# Patient Record
Sex: Female | Born: 1960 | Race: White | Hispanic: No | Marital: Married | Smoking: Former smoker
Health system: Southern US, Community
[De-identification: ages and names within clinical notes are randomized; demographics above are authoritative.]

## PROBLEM LIST (undated history)

## (undated) DIAGNOSIS — F419 Anxiety disorder, unspecified: Secondary | ICD-10-CM

## (undated) DIAGNOSIS — I10 Essential (primary) hypertension: Secondary | ICD-10-CM

## (undated) DIAGNOSIS — R197 Diarrhea, unspecified: Secondary | ICD-10-CM

## (undated) DIAGNOSIS — M1990 Unspecified osteoarthritis, unspecified site: Secondary | ICD-10-CM

## (undated) DIAGNOSIS — F329 Major depressive disorder, single episode, unspecified: Secondary | ICD-10-CM

## (undated) DIAGNOSIS — C801 Malignant (primary) neoplasm, unspecified: Secondary | ICD-10-CM

## (undated) DIAGNOSIS — F32A Depression, unspecified: Secondary | ICD-10-CM

## (undated) DIAGNOSIS — K810 Acute cholecystitis: Secondary | ICD-10-CM

## (undated) HISTORY — PX: ABDOMINAL HYSTERECTOMY: SHX81

## (undated) HISTORY — PX: COLONOSCOPY: SHX174

## (undated) HISTORY — PX: TUBAL LIGATION: SHX77

## (undated) HISTORY — DX: Diarrhea, unspecified: R19.7

## (undated) HISTORY — DX: Acute cholecystitis: K81.0

## (undated) HISTORY — PX: TOOTH EXTRACTION: SUR596

---

## 2013-06-11 ENCOUNTER — Inpatient Hospital Stay (HOSPITAL_COMMUNITY)
Admission: EM | Admit: 2013-06-11 | Discharge: 2013-06-12 | DRG: 493 | Disposition: A | Discharge status: Home / Self Care | Payer: BC Managed Care – PPO | Attending: General Surgery | Admitting: General Surgery

## 2013-06-11 ENCOUNTER — Emergency Department (HOSPITAL_COMMUNITY): Payer: BC Managed Care – PPO

## 2013-06-11 ENCOUNTER — Encounter (HOSPITAL_COMMUNITY): Payer: Self-pay | Admitting: Emergency Medicine

## 2013-06-11 DIAGNOSIS — N83209 Unspecified ovarian cyst, unspecified side: Secondary | ICD-10-CM | POA: Diagnosis present

## 2013-06-11 DIAGNOSIS — K8000 Calculus of gallbladder with acute cholecystitis without obstruction: Principal | ICD-10-CM | POA: Diagnosis present

## 2013-06-11 DIAGNOSIS — K660 Peritoneal adhesions (postprocedural) (postinfection): Secondary | ICD-10-CM | POA: Diagnosis present

## 2013-06-11 DIAGNOSIS — E669 Obesity, unspecified: Secondary | ICD-10-CM | POA: Diagnosis present

## 2013-06-11 DIAGNOSIS — R112 Nausea with vomiting, unspecified: Secondary | ICD-10-CM | POA: Diagnosis present

## 2013-06-11 DIAGNOSIS — I10 Essential (primary) hypertension: Secondary | ICD-10-CM | POA: Diagnosis present

## 2013-06-11 DIAGNOSIS — K810 Acute cholecystitis: Secondary | ICD-10-CM

## 2013-06-11 DIAGNOSIS — F3289 Other specified depressive episodes: Secondary | ICD-10-CM | POA: Diagnosis present

## 2013-06-11 DIAGNOSIS — E876 Hypokalemia: Secondary | ICD-10-CM | POA: Diagnosis not present

## 2013-06-11 DIAGNOSIS — K219 Gastro-esophageal reflux disease without esophagitis: Secondary | ICD-10-CM | POA: Diagnosis present

## 2013-06-11 DIAGNOSIS — F329 Major depressive disorder, single episode, unspecified: Secondary | ICD-10-CM | POA: Diagnosis present

## 2013-06-11 DIAGNOSIS — N83202 Unspecified ovarian cyst, left side: Secondary | ICD-10-CM | POA: Diagnosis present

## 2013-06-11 DIAGNOSIS — F17200 Nicotine dependence, unspecified, uncomplicated: Secondary | ICD-10-CM | POA: Diagnosis present

## 2013-06-11 HISTORY — DX: Depression, unspecified: F32.A

## 2013-06-11 HISTORY — DX: Essential (primary) hypertension: I10

## 2013-06-11 HISTORY — DX: Major depressive disorder, single episode, unspecified: F32.9

## 2013-06-11 LAB — COMPREHENSIVE METABOLIC PANEL
Albumin: 4 g/dL (ref 3.5–5.2)
BUN: 12 mg/dL (ref 6–23)
Calcium: 9.4 mg/dL (ref 8.4–10.5)
Creatinine, Ser: 0.76 mg/dL (ref 0.50–1.10)
Total Bilirubin: 0.4 mg/dL (ref 0.3–1.2)
Total Protein: 7.3 g/dL (ref 6.0–8.3)

## 2013-06-11 LAB — CBC WITH DIFFERENTIAL/PLATELET
Basophils Absolute: 0 10*3/uL (ref 0.0–0.1)
Basophils Relative: 0 % (ref 0–1)
Eosinophils Absolute: 0.1 10*3/uL (ref 0.0–0.7)
Eosinophils Relative: 0 % (ref 0–5)
HCT: 44.1 % (ref 36.0–46.0)
Hemoglobin: 14.6 g/dL (ref 12.0–15.0)
MCH: 29.3 pg (ref 26.0–34.0)
MCHC: 33.1 g/dL (ref 30.0–36.0)
Monocytes Absolute: 0.5 10*3/uL (ref 0.1–1.0)
Monocytes Relative: 4 % (ref 3–12)
RDW: 13.4 % (ref 11.5–15.5)

## 2013-06-11 LAB — URINE MICROSCOPIC-ADD ON

## 2013-06-11 LAB — URINALYSIS, ROUTINE W REFLEX MICROSCOPIC
Ketones, ur: >80 mg/dL — AB
Leukocytes, UA: NEGATIVE
Nitrite: NEGATIVE
Specific Gravity, Urine: 1.024 (ref 1.005–1.030)
pH: 6 (ref 5.0–8.0)

## 2013-06-11 LAB — LIPASE, BLOOD: Lipase: 38 U/L (ref 11–59)

## 2013-06-11 MED ORDER — SODIUM CHLORIDE 0.9 % IV SOLN
Freq: Once | INTRAVENOUS | Status: AC
  Administered 2013-06-11: via INTRAVENOUS

## 2013-06-11 MED ORDER — MORPHINE SULFATE 4 MG/ML IJ SOLN
4.0000 mg | Freq: Once | INTRAMUSCULAR | Status: AC
  Administered 2013-06-11: 4 mg via INTRAVENOUS
  Filled 2013-06-11: qty 1

## 2013-06-11 MED ORDER — ONDANSETRON HCL 4 MG/2ML IJ SOLN
4.0000 mg | Freq: Once | INTRAMUSCULAR | Status: AC
  Administered 2013-06-11: 4 mg via INTRAVENOUS
  Filled 2013-06-11: qty 2

## 2013-06-11 MED ORDER — SODIUM CHLORIDE 0.9 % IV BOLUS (SEPSIS)
1000.0000 mL | Freq: Once | INTRAVENOUS | Status: AC
  Administered 2013-06-11: 1000 mL via INTRAVENOUS

## 2013-06-11 NOTE — ED Notes (Signed)
Just moved here from [place] and has had some abd issues and now she has had vomiting and cramping x 16 hours

## 2013-06-11 NOTE — ED Provider Notes (Signed)
CSN: [id number]     Arrival date & time 06/11/13  1424 History     First MD Initiated Contact with Patient 06/11/13 1655     Chief Complaint  Patient presents with  . Abdominal Pain   (Consider location/radiation/quality/duration/timing/severity/associated sxs/prior Treatment) HPI Comments: Ms. Batra is a  52 yo female with a PMH of HTN and acid reflux who presents with a 1 month history of intermittent, epigastric abdominal pain.  The pain usually last for about 6 hours and then resolves on its own.  The pain is epigastric 8/10 with intermittent "cramping like a fist" that is 10/10 every hour.  She also has non-bloody vomiting associated with the cramping pain.  Her most recent episode began last night and has lasted longer than usual.  She says she can only associate it with late night meals.  The fetal position helps the pain a bit.  She denies CP/SOB, dysuria, fever/fatigue/night sweats, headache.  She has a 1 year history of non- bloody diarrhea.  She has not had a colonoscopy.  Patient is a 53 y.o. female presenting with abdominal pain.  Abdominal Pain Associated symptoms include abdominal pain, nausea and vomiting. Pertinent negatives include no chest pain, fatigue or fever.    Past Medical History  Diagnosis Date  . Depression   . Acid reflux   . Hypertension    No past surgical history on file. No family history on file. History  Substance Use Topics  . Smoking status: Current Every Day Smoker  . Smokeless tobacco: Not on file  . Alcohol Use: Yes   OB History   Grav Para Term Preterm Abortions TAB SAB Ect Mult Living                 Review of Systems  Constitutional: Negative for fever and fatigue.  Respiratory: Negative for shortness of breath.   Cardiovascular: Negative for chest pain and palpitations.  Gastrointestinal: Positive for nausea, vomiting, abdominal pain and diarrhea.  Genitourinary: Negative for dysuria.    Allergies  Iodine and Shellfish  allergy  Home Medications   Current Outpatient Rx  Name  Route  Sig  Dispense  Refill  . buPROPion (WELLBUTRIN XL) 150 MG 24 hr tablet   Oral   Take 150 mg by mouth daily.         Marland Kitchen lisinopril-hydrochlorothiazide (PRINZIDE,ZESTORETIC) 20-12.5 MG per tablet   Oral   Take 1 tablet by mouth daily.         . naproxen sodium (ALEVE) 220 MG tablet   Oral   Take 440 mg by mouth 2 (two) times daily with a meal.         . VITAMIN D, ERGOCALCIFEROL, PO   Oral   Take 1 tablet by mouth daily.          BP 161/77  Temp(Src) 98.1 F (36.7 C)  Resp 69  SpO2 99% Physical Exam  Constitutional: She is oriented to person, place, and time. She appears well-developed and well-nourished. No distress.  HENT:  Mouth/Throat: Oropharynx is clear and moist.  Eyes: EOM are normal. Pupils are equal, round, and reactive to light.  Neck: Neck supple.  Cardiovascular: Normal rate, regular rhythm and normal heart sounds.   Pulmonary/Chest: Effort normal and breath sounds normal.  Abdominal: Soft. Bowel sounds are normal. She exhibits no distension. There is tenderness. There is no guarding.  Epigastric and LLQ tenderness.  No hepatosplenomegaly appreciated.  Musculoskeletal: Normal range of motion. She exhibits no edema and  no tenderness.  Neurological: She is alert and oriented to person, place, and time.  Skin: She is not diaphoretic.  Psychiatric: She has a normal mood and affect. Her behavior is normal.    ED Course   Procedures (including critical care time)  Labs Reviewed  CBC WITH DIFFERENTIAL - Abnormal; Notable for the following:    WBC 12.8 (*)    Neutrophils Relative % 79 (*)    Neutro Abs 10.1 (*)    All other components within normal limits  COMPREHENSIVE METABOLIC PANEL - Abnormal; Notable for the following:    Glucose, Bld 105 (*)    All other components within normal limits  LIPASE, BLOOD  URINALYSIS, ROUTINE W REFLEX MICROSCOPIC   Dg Abd Acute W/chest  06/11/2013    *RADIOLOGY REPORT*  Clinical Data: Recurrent abdominal pain  ACUTE ABDOMEN SERIES (ABDOMEN 2 VIEW & CHEST 1 VIEW)  Comparison: None.  Findings: Cardiomediastinal silhouette is unremarkable.  No acute infiltrate or pleural effusion.  No pulmonary edema.  There is nonspecific nonobstructive bowel gas pattern.  Moderate stool noted in proximal colon.  Minimal gaseous distended small bowel loops in mid abdomen suspicious for mild ileus. Mild levoscoliosis lumbar spine. No free abdominal air.  IMPRESSION: No acute disease within chest. Moderate stool in the right colon. Nonobstructive bowel gas pattern.  Minimal gaseous distended small bowel loops mid abdomen suspicious for mild ileus.   Original Report Authenticated By: Natasha Mead, M.D.   No diagnosis found.  MDM  52 yo with HTN and acid reflux here with worsening abdominal pain for the past month.  Lipase normal making pancreatitis unlikely.  No CP/SOB but will obtain EKG.  Abdominal x-ray reveals no acute disease.  Pt with elevated WBC and otherwise normal labs.  Will obtain abd/pelvic CT for further assessment.  Pt feeling better and able to tolerate oral contrast.  Awaiting CT abd/pelvis.  Care transferred to [facility] at 8:20PM.  Evelena Peat, DO 06/11/13 2022

## 2013-06-11 NOTE — ED Notes (Signed)
Return to room C 24 from [place]

## 2013-06-12 ENCOUNTER — Encounter (HOSPITAL_COMMUNITY): Payer: Self-pay | Admitting: Critical Care Medicine

## 2013-06-12 ENCOUNTER — Encounter (HOSPITAL_COMMUNITY): Admission: EM | Disposition: A | Discharge status: Home / Self Care | Payer: Self-pay | Source: Home / Self Care

## 2013-06-12 ENCOUNTER — Encounter (HOSPITAL_COMMUNITY): Payer: Self-pay | Admitting: General Surgery

## 2013-06-12 ENCOUNTER — Inpatient Hospital Stay (HOSPITAL_COMMUNITY): Payer: BC Managed Care – PPO | Admitting: Critical Care Medicine

## 2013-06-12 DIAGNOSIS — K8000 Calculus of gallbladder with acute cholecystitis without obstruction: Secondary | ICD-10-CM

## 2013-06-12 DIAGNOSIS — K810 Acute cholecystitis: Secondary | ICD-10-CM

## 2013-06-12 DIAGNOSIS — I10 Essential (primary) hypertension: Secondary | ICD-10-CM | POA: Diagnosis present

## 2013-06-12 DIAGNOSIS — N83209 Unspecified ovarian cyst, unspecified side: Secondary | ICD-10-CM

## 2013-06-12 DIAGNOSIS — N83202 Unspecified ovarian cyst, left side: Secondary | ICD-10-CM | POA: Diagnosis present

## 2013-06-12 HISTORY — DX: Acute cholecystitis: K81.0

## 2013-06-12 HISTORY — PX: CHOLECYSTECTOMY: SHX55

## 2013-06-12 LAB — SURGICAL PCR SCREEN
MRSA, PCR: NEGATIVE
Staphylococcus aureus: NEGATIVE

## 2013-06-12 LAB — COMPREHENSIVE METABOLIC PANEL
AST: 13 U/L (ref 0–37)
Albumin: 3.1 g/dL — ABNORMAL LOW (ref 3.5–5.2)
Alkaline Phosphatase: 59 U/L (ref 39–117)
BUN: 7 mg/dL (ref 6–23)
Chloride: 105 mEq/L (ref 96–112)
Potassium: 3.3 mEq/L — ABNORMAL LOW (ref 3.5–5.1)
Total Protein: 6 g/dL (ref 6.0–8.3)

## 2013-06-12 SURGERY — LAPAROSCOPIC CHOLECYSTECTOMY
Anesthesia: General | Site: Abdomen | Wound class: Clean Contaminated

## 2013-06-12 MED ORDER — LISINOPRIL-HYDROCHLOROTHIAZIDE 20-12.5 MG PO TABS: 1.0000 | ORAL_TABLET | Freq: Every day | ORAL | Status: DC

## 2013-06-12 MED ORDER — MORPHINE SULFATE 2 MG/ML IJ SOLN
1.0000 mg | INTRAMUSCULAR | Status: DC | PRN
  Administered 2013-06-12: 4 mg via INTRAVENOUS
  Filled 2013-06-12: qty 2

## 2013-06-12 MED ORDER — HYDROMORPHONE HCL PF 1 MG/ML IJ SOLN
INTRAMUSCULAR | Status: AC
  Filled 2013-06-12: qty 1

## 2013-06-12 MED ORDER — POTASSIUM CHLORIDE CRYS ER 20 MEQ PO TBCR
40.0000 meq | EXTENDED_RELEASE_TABLET | Freq: Once | ORAL | Status: DC
  Filled 2013-06-12 (×2): qty 2

## 2013-06-12 MED ORDER — PROPOFOL 10 MG/ML IV BOLUS
INTRAVENOUS | Status: DC | PRN
  Administered 2013-06-12: 140 mg via INTRAVENOUS

## 2013-06-12 MED ORDER — PIPERACILLIN-TAZOBACTAM 3.375 G IVPB
3.3750 g | Freq: Three times a day (TID) | INTRAVENOUS | Status: DC
  Administered 2013-06-12 (×2): 3.375 g via INTRAVENOUS
  Filled 2013-06-12 (×4): qty 50

## 2013-06-12 MED ORDER — BUPIVACAINE-EPINEPHRINE 0.25% -1:200000 IJ SOLN
INTRAMUSCULAR | Status: DC | PRN
  Administered 2013-06-12: 30 mL

## 2013-06-12 MED ORDER — HYDROMORPHONE HCL PF 1 MG/ML IJ SOLN
0.2500 mg | INTRAMUSCULAR | Status: DC | PRN
  Administered 2013-06-12 (×3): 0.5 mg via INTRAVENOUS

## 2013-06-12 MED ORDER — MIDAZOLAM HCL 5 MG/5ML IJ SOLN
INTRAMUSCULAR | Status: DC | PRN
  Administered 2013-06-12: 2 mg via INTRAVENOUS

## 2013-06-12 MED ORDER — ROCURONIUM BROMIDE 100 MG/10ML IV SOLN
INTRAVENOUS | Status: DC | PRN
  Administered 2013-06-12: 50 mg via INTRAVENOUS

## 2013-06-12 MED ORDER — NEOSTIGMINE METHYLSULFATE 1 MG/ML IJ SOLN
INTRAMUSCULAR | Status: DC | PRN
  Administered 2013-06-12: 4 mg via INTRAVENOUS

## 2013-06-12 MED ORDER — LACTATED RINGERS IV SOLN
INTRAVENOUS | Status: DC | PRN
  Administered 2013-06-12 (×2): via INTRAVENOUS

## 2013-06-12 MED ORDER — OXYCODONE HCL 5 MG PO TABS: 5.0000 mg | ORAL_TABLET | ORAL | Status: DC | PRN

## 2013-06-12 MED ORDER — HEPARIN SODIUM (PORCINE) 5000 UNIT/ML IJ SOLN: 5000.0000 [IU] | Freq: Once | INTRAMUSCULAR | Status: DC

## 2013-06-12 MED ORDER — SODIUM CHLORIDE 0.9 % IR SOLN
Status: DC | PRN
  Administered 2013-06-12: 1000 mL

## 2013-06-12 MED ORDER — ONDANSETRON HCL 4 MG/2ML IJ SOLN: 4.0000 mg | Freq: Once | INTRAMUSCULAR | Status: DC | PRN

## 2013-06-12 MED ORDER — MEPERIDINE HCL 25 MG/ML IJ SOLN: 6.2500 mg | INTRAMUSCULAR | Status: DC | PRN

## 2013-06-12 MED ORDER — ONDANSETRON HCL 4 MG/2ML IJ SOLN: 4.0000 mg | Freq: Four times a day (QID) | INTRAMUSCULAR | Status: DC | PRN

## 2013-06-12 MED ORDER — OXYCODONE HCL 5 MG/5ML PO SOLN: 5.0000 mg | Freq: Once | ORAL | Status: DC | PRN

## 2013-06-12 MED ORDER — DIPHENHYDRAMINE HCL 50 MG/ML IJ SOLN: 12.5000 mg | Freq: Four times a day (QID) | INTRAMUSCULAR | Status: DC | PRN

## 2013-06-12 MED ORDER — PANTOPRAZOLE SODIUM 40 MG PO TBEC: 40.0000 mg | DELAYED_RELEASE_TABLET | Freq: Every day | ORAL | Status: DC

## 2013-06-12 MED ORDER — LIDOCAINE HCL (CARDIAC) 20 MG/ML IV SOLN
INTRAVENOUS | Status: DC | PRN
  Administered 2013-06-12: 100 mg via INTRAVENOUS

## 2013-06-12 MED ORDER — DEXAMETHASONE SODIUM PHOSPHATE 4 MG/ML IJ SOLN
INTRAMUSCULAR | Status: DC | PRN
  Administered 2013-06-12: 4 mg via INTRAVENOUS

## 2013-06-12 MED ORDER — DIPHENHYDRAMINE HCL 12.5 MG/5ML PO ELIX: 12.5000 mg | ORAL_SOLUTION | Freq: Four times a day (QID) | ORAL | Status: DC | PRN

## 2013-06-12 MED ORDER — GLYCOPYRROLATE 0.2 MG/ML IJ SOLN
INTRAMUSCULAR | Status: DC | PRN
  Administered 2013-06-12: 0.6 mg via INTRAVENOUS

## 2013-06-12 MED ORDER — PANTOPRAZOLE SODIUM 40 MG IV SOLR
40.0000 mg | INTRAVENOUS | Status: DC
  Filled 2013-06-12 (×2): qty 40

## 2013-06-12 MED ORDER — FENTANYL CITRATE 0.05 MG/ML IJ SOLN
INTRAMUSCULAR | Status: DC | PRN
  Administered 2013-06-12: 100 ug via INTRAVENOUS
  Administered 2013-06-12 (×3): 50 ug via INTRAVENOUS

## 2013-06-12 MED ORDER — HYDROCHLOROTHIAZIDE 12.5 MG PO CAPS
12.5000 mg | ORAL_CAPSULE | Freq: Every day | ORAL | Status: DC
  Administered 2013-06-12: 12.5 mg via ORAL
  Filled 2013-06-12: qty 1

## 2013-06-12 MED ORDER — OXYCODONE HCL 5 MG PO TABS: 5.0000 mg | ORAL_TABLET | Freq: Once | ORAL | Status: DC | PRN

## 2013-06-12 MED ORDER — LISINOPRIL 20 MG PO TABS
20.0000 mg | ORAL_TABLET | Freq: Every day | ORAL | Status: DC
  Administered 2013-06-12: 20 mg via ORAL
  Filled 2013-06-12: qty 1

## 2013-06-12 MED ORDER — ONDANSETRON HCL 4 MG/2ML IJ SOLN
INTRAMUSCULAR | Status: DC | PRN
  Administered 2013-06-12 (×2): 4 mg via INTRAVENOUS

## 2013-06-12 MED ORDER — KCL IN DEXTROSE-NACL 20-5-0.45 MEQ/L-%-% IV SOLN
INTRAVENOUS | Status: DC
  Administered 2013-06-12: 02:00:00 via INTRAVENOUS
  Filled 2013-06-12 (×4): qty 1000

## 2013-06-12 SURGICAL SUPPLY — 43 items
APPLIER CLIP 5 13 M/L LIGAMAX5 (MISCELLANEOUS) ×3
APPLIER CLIP ROT 10 11.4 M/L (STAPLE)
BLADE SURG ROTATE 9660 (MISCELLANEOUS) IMPLANT
CANISTER SUCTION 2500CC (MISCELLANEOUS) ×3 IMPLANT
CHLORAPREP W/TINT 26ML (MISCELLANEOUS) ×3 IMPLANT
CLIP APPLIE 5 13 M/L LIGAMAX5 (MISCELLANEOUS) ×2 IMPLANT
CLIP APPLIE ROT 10 11.4 M/L (STAPLE) IMPLANT
CLOTH BEACON ORANGE TIMEOUT ST (SAFETY) ×3 IMPLANT
COVER MAYO STAND STRL (DRAPES) ×3 IMPLANT
COVER SURGICAL LIGHT HANDLE (MISCELLANEOUS) ×3 IMPLANT
DECANTER SPIKE VIAL GLASS SM (MISCELLANEOUS) IMPLANT
DERMABOND ADVANCED (GAUZE/BANDAGES/DRESSINGS) ×1
DERMABOND ADVANCED .7 DNX12 (GAUZE/BANDAGES/DRESSINGS) ×2 IMPLANT
DRAPE C-ARM 42X72 X-RAY (DRAPES) ×3 IMPLANT
DRAPE UTILITY 15X26 W/TAPE STR (DRAPE) ×6 IMPLANT
ELECT REM PT RETURN 9FT ADLT (ELECTROSURGICAL) ×3
ELECTRODE REM PT RTRN 9FT ADLT (ELECTROSURGICAL) ×2 IMPLANT
FILTER SMOKE EVAC LAPAROSHD (FILTER) ×3 IMPLANT
GLOVE BIO SURGEON STRL SZ8 (GLOVE) ×3 IMPLANT
GLOVE BIOGEL PI IND STRL 7.0 (GLOVE) ×4 IMPLANT
GLOVE BIOGEL PI IND STRL 8 (GLOVE) ×2 IMPLANT
GLOVE BIOGEL PI INDICATOR 7.0 (GLOVE) ×2
GLOVE BIOGEL PI INDICATOR 8 (GLOVE) ×1
GOWN STRL NON-REIN LRG LVL3 (GOWN DISPOSABLE) IMPLANT
GOWN STRL REIN XL XLG (GOWN DISPOSABLE) IMPLANT
KIT BASIN OR (CUSTOM PROCEDURE TRAY) ×3 IMPLANT
KIT ROOM TURNOVER OR (KITS) ×3 IMPLANT
NEEDLE 22X1 1/2 (OR ONLY) (NEEDLE) ×3 IMPLANT
NS IRRIG 1000ML POUR BTL (IV SOLUTION) ×3 IMPLANT
PAD ARMBOARD 7.5X6 YLW CONV (MISCELLANEOUS) ×3 IMPLANT
POUCH SPECIMEN RETRIEVAL 10MM (ENDOMECHANICALS) IMPLANT
SCISSORS LAP 5X35 DISP (ENDOMECHANICALS) IMPLANT
SET CHOLANGIOGRAPH 5 50 .035 (SET/KITS/TRAYS/PACK) ×3 IMPLANT
SET IRRIG TUBING LAPAROSCOPIC (IRRIGATION / IRRIGATOR) ×3 IMPLANT
SLEEVE ENDOPATH XCEL 5M (ENDOMECHANICALS) ×6 IMPLANT
SPECIMEN JAR SMALL (MISCELLANEOUS) ×3 IMPLANT
SUT VIC AB 4-0 PS2 27 (SUTURE) ×3 IMPLANT
TOWEL OR 17X24 6PK STRL BLUE (TOWEL DISPOSABLE) ×3 IMPLANT
TOWEL OR 17X26 10 PK STRL BLUE (TOWEL DISPOSABLE) ×3 IMPLANT
TRAY LAPAROSCOPIC (CUSTOM PROCEDURE TRAY) ×3 IMPLANT
TROCAR XCEL BLUNT TIP 100MML (ENDOMECHANICALS) ×3 IMPLANT
TROCAR XCEL NON-BLD 5MMX100MML (ENDOMECHANICALS) ×3 IMPLANT
WATER STERILE IRR 1000ML POUR (IV SOLUTION) IMPLANT

## 2013-06-12 NOTE — Preoperative (Signed)
Beta Blockers   Reason not to administer Beta Blockers:Not Applicable 

## 2013-06-12 NOTE — Progress Notes (Signed)
Day of Surgery  Subjective: RUQ pain  Objective: Vital signs in last 24 hours: Temp:  [98 F (36.7 C)-98.8 F (37.1 C)] 98 F (36.7 C) (07/29 0755) Pulse Rate:  [65-76] 71 (07/29 0755) Resp:  [15-69] 17 (07/29 0544) BP: (151-176)/(72-90) 155/72 mmHg (07/29 0755) SpO2:  [93 %-99 %] 95 % (07/29 0755) Weight:  [54.114 kg (119 lb 4.8 oz)] 54.114 kg (119 lb 4.8 oz) (07/29 0151)    Intake/Output from previous day: 07/28 0701 - 07/29 0700 In: 412.5 [I.V.:362.5; IV Piggyback:50] Out: 300 [Urine:300] Intake/Output this shift:    A&O Lungs CTA CV RR%R ABD tender RUQ  Lab Results:   Recent Labs  06/11/13 1400  WBC 12.8*  HGB 14.6  HCT 44.1  PLT 290   BMET  Recent Labs  06/11/13 1400 06/12/13 0420  NA 139 138  K 4.2 3.3*  CL 104 105  CO2 26 26  GLUCOSE 105* 111*  BUN 12 7  CREATININE 0.76 0.76  CALCIUM 9.4 8.2*   PT/INR No results found for this basename: LABPROT, INR,  in the last 72 hours ABG No results found for this basename: PHART, PCO2, PO2, HCO3,  in the last 72 hours  Studies/Results: Ct Abdomen Pelvis Wo Contrast  06/11/2013   *RADIOLOGY REPORT*  Clinical Data: Abdominal pain and cramping with associated vomiting.  CT ABDOMEN AND PELVIS WITHOUT CONTRAST  Technique:  Multidetector CT imaging of the abdomen and pelvis was performed following the standard protocol without intravenous contrast.  Comparison: None.  Findings: There is at least one calculus present in the neck of the gallbladder measuring roughly 1.7 cm.  The gallbladder does appear to be potentially mildly edematous and distended.  Correlation suggested with focal pain.  Ultrasound correlation may also be helpful.  The liver is unremarkable.  There is no evidence of biliary ductal dilatation.  Unenhanced appearance of the pancreas, spleen, adrenal glands and kidneys are within normal limits.  In the pelvis, a complex cystic structures noted on the left measuring just under 5 cm in diameter.   Internal density is approximately 25 HU.  This could represent a complex cyst or mass of the left ovary.  The uterus has been removed and there appears to be a small right ovary in the lower pelvis.  Ultimate correlation with pelvic sonography would be helpful.  No evidence of enlarged lymph nodes or hernia.  No abnormal fluid collections seen.  Bony structures are unremarkable.  IMPRESSION:  1.  Cholelithiasis with at least one gallstone identified in the neck of the gallbladder.  The gallbladder wall appears potentially mildly edematous and inflamed.  Recommend correlation with focal tenderness.  Ultrasound may also be helpful to evaluate the gallbladder. 2.  5 cm complex cystic structure of the left pelvis, presumably originating from the left ovary / adnexa.  Neoplasm is not excluded.  Recommend correlation with pelvic ultrasound.   Original Report Authenticated By: Irish Lack, M.D.   US Abdomen Complete  06/11/2013   *RADIOLOGY REPORT*  Clinical Data:  Abdominal pain for 1 month.  Nausea vomiting.  COMPLETE ABDOMINAL ULTRASOUND  Comparison:  CT from the same day.  Findings:  Gallbladder:  Numerous echogenic, shadowing stones.  Gallbladder wall thickening up to 8 mm.  There is pericholecystic fluid or wall striation.  Negative sonographic Murphy's sign.  Common bile duct:  Measures 5 mm  Liver:  No focal lesion identified.  Within normal limits in parenchymal echogenicity.  IVC:  Appears normal by gray scale.  Pancreas:  No focal abnormality seen.  Spleen:  Measures 9.5 cm.  Right Kidney:  Measures 12 cm.  No focal abnormality.  Left Kidney:  Measures 11 cm.  No focal abnormality.  Abdominal aorta:  No aneurysm identified.  Maximal diameter 2.5 cm.  Critical Value/emergent results were called by telephone at the time of interpretation on 06/11/2013 at 23 30 to Dr. Junious Silk, who verbally acknowledged these results.  IMPRESSION:  Cholelithiasis and gallbladder inflammatory changes suggest acute cholecystitis.  Negative sonographic Murphy's sign, which may be related to narcotic administration.   Original Report Authenticated By: Tiburcio Pea   US Transvaginal Non-ob  06/11/2013   *RADIOLOGY REPORT*  Clinical Data: Hysterectomy.  Abdominal pain.  TRANSABDOMINAL AND TRANSVAGINAL ULTRASOUND OF PELVIS Technique:  Both transabdominal and transvaginal ultrasound examinations of the pelvis were performed. Transabdominal technique was performed for global imaging of the pelvis including uterus, ovaries, adnexal regions, and pelvic cul-de-sac.  It was necessary to proceed with endovaginal exam following the transabdominal exam to visualize the ovaries bilaterally.  Comparison:  06/11/2013 CT.  Findings:  Uterus: Surgically absent.  Endometrium: Not applicable.  Right ovary:  53 mm x 20 mm x 38 mm.  Small cysts are present with septations.  The largest measures 26 mm x 10 mm x 31 mm. Septations appear relatively thin without convincing evidence of vascular flow. The sonographer notes multiple calcifications in the right ovary sonographically however the prior CT shows no calcifications and is more sensitive.  Left ovary: Poor visualization transvaginally.  Transabdominal approach was performed.  Transabdominal approach demonstrates a 52 mm x 49 mm x 45 mm cystic lesion without septations or solid component.  No internal vascular flow is identified.  Other findings: Small amount of free fluid in the anatomic pelvis.  Bilateral color flow to the ovaries is observed.  IMPRESSION: 1.  No acute abnormality. 2.  Bilateral ovarian cystic lesions, larger on the left than right as demonstrated on CT.  The septations are more worrisome in the right ovary.  The most reasonable follow-up would be nonemergent follow-up surgical consultation and repeat pelvic ultrasound in 12 weeks to assess for resolution.  Although the probability of neoplasm is considered low, the presence of multiple septations makes exclusion of neoplasm impossible.    Original Report Authenticated By: Andreas Newport, M.D.   US Pelvis Complete  06/11/2013   *RADIOLOGY REPORT*  Clinical Data: Hysterectomy.  Abdominal pain.  TRANSABDOMINAL AND TRANSVAGINAL ULTRASOUND OF PELVIS Technique:  Both transabdominal and transvaginal ultrasound examinations of the pelvis were performed. Transabdominal technique was performed for global imaging of the pelvis including uterus, ovaries, adnexal regions, and pelvic cul-de-sac.  It was necessary to proceed with endovaginal exam following the transabdominal exam to visualize the ovaries bilaterally.  Comparison:  06/11/2013 CT.  Findings:  Uterus: Surgically absent.  Endometrium: Not applicable.  Right ovary:  53 mm x 20 mm x 38 mm.  Small cysts are present with septations.  The largest measures 26 mm x 10 mm x 31 mm. Septations appear relatively thin without convincing evidence of vascular flow. The sonographer notes multiple calcifications in the right ovary sonographically however the prior CT shows no calcifications and is more sensitive.  Left ovary: Poor visualization transvaginally.  Transabdominal approach was performed.  Transabdominal approach demonstrates a 52 mm x 49 mm x 45 mm cystic lesion without septations or solid component.  No internal vascular flow is identified.  Other findings: Small amount of free fluid in the anatomic pelvis.  Bilateral  color flow to the ovaries is observed.  IMPRESSION: 1.  No acute abnormality. 2.  Bilateral ovarian cystic lesions, larger on the left than right as demonstrated on CT.  The septations are more worrisome in the right ovary.  The most reasonable follow-up would be nonemergent follow-up surgical consultation and repeat pelvic ultrasound in 12 weeks to assess for resolution.  Although the probability of neoplasm is considered low, the presence of multiple septations makes exclusion of neoplasm impossible.   Original Report Authenticated By: Andreas Newport, M.D.   Dg Abd Acute  W/chest  06/11/2013   *RADIOLOGY REPORT*  Clinical Data: Recurrent abdominal pain  ACUTE ABDOMEN SERIES (ABDOMEN 2 VIEW & CHEST 1 VIEW)  Comparison: None.  Findings: Cardiomediastinal silhouette is unremarkable.  No acute infiltrate or pleural effusion.  No pulmonary edema.  There is nonspecific nonobstructive bowel gas pattern.  Moderate stool noted in proximal colon.  Minimal gaseous distended small bowel loops in mid abdomen suspicious for mild ileus. Mild levoscoliosis lumbar spine. No free abdominal air.  IMPRESSION: No acute disease within chest. Moderate stool in the right colon. Nonobstructive bowel gas pattern.  Minimal gaseous distended small bowel loops mid abdomen suspicious for mild ileus.   Original Report Authenticated By: Natasha Mead, M.D.    Anti-infectives: Anti-infectives   Start     Dose/Rate Route Frequency Ordered Stop   06/12/13 0145  piperacillin-tazobactam (ZOSYN) IVPB 3.375 g     3.375 g 12.5 mL/hr over 240 Minutes Intravenous 3 times per day 06/12/13 0144        Assessment/Plan: s/p Procedure(s): LAPAROSCOPIC CHOLECYSTECTOMY WITH INTRAOPERATIVE CHOLANGIOGRAM (N/A) Acute cholecystitis - to OR for lap chole/IOC. I discussed the procedure in detail. We discussed the risks and benefits of a laparoscopic cholecystectomy and possible cholangiogram including, but not limited to bleeding, infection, injury to surrounding structures such as the intestine or liver, bile leak, retained gallstones, need to convert to an open procedure, prolonged diarrhea, blood clots such as  DVT, common bile duct injury, anesthesia risks, and possible need for additional procedures.  The likelihood of improvement in symptoms and return to the patient's normal status is good. We discussed the typical post-operative recovery course. I also spoke to her husband.   LOS: 1 day    Tashana Haberl E 06/12/2013

## 2013-06-12 NOTE — H&P (Signed)
Kelsey Payne is an 52 y.o. female.   Chief Complaint: Stomach pain HPI: 52 year old obese Caucasian female comes to the ER because of persistent epigastric abdominal pain that started last night. It was associated with nausea and vomiting. She states for the past month she has been having intermittent episodes of epigastric pain generally occurring at night it lasts for about 6 hours at a time. She states the pain will be sharp and intense almost like a fist is clenching her insides. She hasn't found anything that relieves the pain. Her thoughts have been that it's been due to indigestion. She states that she came to the emergency department because the pain was persistent and did not resolve after it usually does. She denies any weight loss. She states that she's gained about 40 pounds over the past year. They have recently moved to [place]. She is adopted. She denies any melena or hematochezia. She states that she's had a several month history of loose stool. She denies any NSAID use. She denies any acholic stools. She denies any jaundice.  A CT scan of her abdomen and pelvis in addition to an abdominal ultrasound as well as pelvic ultrasound was then performed. She was found to have incidental bilateral ovarian cysts as well  Past Medical History  Diagnosis Date  . Depression   . Acid reflux   . Hypertension     Past Surgical History  Procedure Laterality Date  . Abdominal hysterectomy      Family History  Problem Relation Age of Onset  . Adopted: Yes   Social History:  reports that she has been smoking Cigarettes.  She has been smoking about 0.00 packs per day for the past 36 years. She does not have any smokeless tobacco history on file. She reports that  drinks alcohol. She reports that she uses illicit drugs.  Allergies:  Allergies  Allergen Reactions  . Iodine Anaphylaxis  . Shellfish Allergy Anaphylaxis     (Not in a hospital admission)  Results for orders placed  during the hospital encounter of 06/11/13 (from the past 48 hour(s))  CBC WITH DIFFERENTIAL     Status: Abnormal   Collection Time    06/11/13  2:00 PM      Result Value Range   WBC 12.8 (*) 4.0 - 10.5 K/uL   RBC 4.98  3.87 - 5.11 MIL/uL   Hemoglobin 14.6  12.0 - 15.0 g/dL   HCT 13.2  44.0 - 10.2 %   MCV 88.6  78.0 - 100.0 fL   MCH 29.3  26.0 - 34.0 pg   MCHC 33.1  30.0 - 36.0 g/dL   RDW 72.5  36.6 - 44.0 %   Platelets 290  150 - 400 K/uL   Neutrophils Relative % 79 (*) 43 - 77 %   Neutro Abs 10.1 (*) 1.7 - 7.7 K/uL   Lymphocytes Relative 16  12 - 46 %   Lymphs Abs 2.1  0.7 - 4.0 K/uL   Monocytes Relative 4  3 - 12 %   Monocytes Absolute 0.5  0.1 - 1.0 K/uL   Eosinophils Relative 0  0 - 5 %   Eosinophils Absolute 0.1  0.0 - 0.7 K/uL   Basophils Relative 0  0 - 1 %   Basophils Absolute 0.0  0.0 - 0.1 K/uL  COMPREHENSIVE METABOLIC PANEL     Status: Abnormal   Collection Time    06/11/13  2:00 PM      Result Value Range  Sodium 139  135 - 145 mEq/L   Potassium 4.2  3.5 - 5.1 mEq/L   Chloride 104  96 - 112 mEq/L   CO2 26  19 - 32 mEq/L   Glucose, Bld 105 (*) 70 - 99 mg/dL   BUN 12  6 - 23 mg/dL   Creatinine, Ser 1.19  0.50 - 1.10 mg/dL   Calcium 9.4  8.4 - 14.7 mg/dL   Total Protein 7.3  6.0 - 8.3 g/dL   Albumin 4.0  3.5 - 5.2 g/dL   AST 18  0 - 37 U/L   ALT 16  0 - 35 U/L   Alkaline Phosphatase 72  39 - 117 U/L   Total Bilirubin 0.4  0.3 - 1.2 mg/dL   GFR calc non Af Amer >90  >90 mL/min   GFR calc Af Amer >90  >90 mL/min   Comment:            The eGFR has been calculated     using the CKD EPI equation.     This calculation has not been     validated in all clinical     situations.     eGFR's persistently     <90 mL/min signify     possible Chronic Kidney Disease.  LIPASE, BLOOD     Status: None   Collection Time    06/11/13  2:00 PM      Result Value Range   Lipase 38  11 - 59 U/L  URINALYSIS, ROUTINE W REFLEX MICROSCOPIC     Status: Abnormal   Collection  Time    06/11/13  7:50 PM      Result Value Range   Color, Urine YELLOW  YELLOW   APPearance CLEAR  CLEAR   Specific Gravity, Urine 1.024  1.005 - 1.030   pH 6.0  5.0 - 8.0   Glucose, UA NEGATIVE  NEGATIVE mg/dL   Hgb urine dipstick TRACE (*) NEGATIVE   Bilirubin Urine NEGATIVE  NEGATIVE   Ketones, ur >80 (*) NEGATIVE mg/dL   Protein, ur NEGATIVE  NEGATIVE mg/dL   Urobilinogen, UA 0.2  0.0 - 1.0 mg/dL   Nitrite NEGATIVE  NEGATIVE   Leukocytes, UA NEGATIVE  NEGATIVE  URINE MICROSCOPIC-ADD ON     Status: Abnormal   Collection Time    06/11/13  7:50 PM      Result Value Range   Squamous Epithelial / LPF FEW (*) RARE   WBC, UA 0-2  <3 WBC/hpf   RBC / HPF 3-6  <3 RBC/hpf   Bacteria, UA FEW (*) RARE   Urine-Other MUCOUS PRESENT     Ct Abdomen Pelvis Wo Contrast  06/11/2013   *RADIOLOGY REPORT*  Clinical Data: Abdominal pain and cramping with associated vomiting.  CT ABDOMEN AND PELVIS WITHOUT CONTRAST  Technique:  Multidetector CT imaging of the abdomen and pelvis was performed following the standard protocol without intravenous contrast.  Comparison: None.  Findings: There is at least one calculus present in the neck of the gallbladder measuring roughly 1.7 cm.  The gallbladder does appear to be potentially mildly edematous and distended.  Correlation suggested with focal pain.  Ultrasound correlation may also be helpful.  The liver is unremarkable.  There is no evidence of biliary ductal dilatation.  Unenhanced appearance of the pancreas, spleen, adrenal glands and kidneys are within normal limits.  In the pelvis, a complex cystic structures noted on the left measuring just under 5 cm in diameter.  Internal density  is approximately 25 HU.  This could represent a complex cyst or mass of the left ovary.  The uterus has been removed and there appears to be a small right ovary in the lower pelvis.  Ultimate correlation with pelvic sonography would be helpful.  No evidence of enlarged lymph nodes or  hernia.  No abnormal fluid collections seen.  Bony structures are unremarkable.  IMPRESSION:  1.  Cholelithiasis with at least one gallstone identified in the neck of the gallbladder.  The gallbladder wall appears potentially mildly edematous and inflamed.  Recommend correlation with focal tenderness.  Ultrasound may also be helpful to evaluate the gallbladder. 2.  5 cm complex cystic structure of the left pelvis, presumably originating from the left ovary / adnexa.  Neoplasm is not excluded.  Recommend correlation with pelvic ultrasound.   Original Report Authenticated By: Irish Lack, M.D.   US Abdomen Complete  06/11/2013   *RADIOLOGY REPORT*  Clinical Data:  Abdominal pain for 1 month.  Nausea vomiting.  COMPLETE ABDOMINAL ULTRASOUND  Comparison:  CT from the same day.  Findings:  Gallbladder:  Numerous echogenic, shadowing stones.  Gallbladder wall thickening up to 8 mm.  There is pericholecystic fluid or wall striation.  Negative sonographic Murphy's sign.  Common bile duct:  Measures 5 mm  Liver:  No focal lesion identified.  Within normal limits in parenchymal echogenicity.  IVC:  Appears normal by gray scale.  Pancreas:  No focal abnormality seen.  Spleen:  Measures 9.5 cm.  Right Kidney:  Measures 12 cm.  No focal abnormality.  Left Kidney:  Measures 11 cm.  No focal abnormality.  Abdominal aorta:  No aneurysm identified.  Maximal diameter 2.5 cm.  Critical Value/emergent results were called by telephone at the time of interpretation on 06/11/2013 at 23 30 to Dr. Junious Silk, who verbally acknowledged these results.  IMPRESSION:  Cholelithiasis and gallbladder inflammatory changes suggest acute cholecystitis. Negative sonographic Murphy's sign, which may be related to narcotic administration.   Original Report Authenticated By: Tiburcio Pea   US Transvaginal Non-ob  06/11/2013   *RADIOLOGY REPORT*  Clinical Data: Hysterectomy.  Abdominal pain.  TRANSABDOMINAL AND TRANSVAGINAL ULTRASOUND OF PELVIS  Technique:  Both transabdominal and transvaginal ultrasound examinations of the pelvis were performed. Transabdominal technique was performed for global imaging of the pelvis including uterus, ovaries, adnexal regions, and pelvic cul-de-sac.  It was necessary to proceed with endovaginal exam following the transabdominal exam to visualize the ovaries bilaterally.  Comparison:  06/11/2013 CT.  Findings:  Uterus: Surgically absent.  Endometrium: Not applicable.  Right ovary:  53 mm x 20 mm x 38 mm.  Small cysts are present with septations.  The largest measures 26 mm x 10 mm x 31 mm. Septations appear relatively thin without convincing evidence of vascular flow. The sonographer notes multiple calcifications in the right ovary sonographically however the prior CT shows no calcifications and is more sensitive.  Left ovary: Poor visualization transvaginally.  Transabdominal approach was performed.  Transabdominal approach demonstrates a 52 mm x 49 mm x 45 mm cystic lesion without septations or solid component.  No internal vascular flow is identified.  Other findings: Small amount of free fluid in the anatomic pelvis.  Bilateral color flow to the ovaries is observed.  IMPRESSION: 1.  No acute abnormality. 2.  Bilateral ovarian cystic lesions, larger on the left than right as demonstrated on CT.  The septations are more worrisome in the right ovary.  The most reasonable follow-up would be nonemergent follow-up  surgical consultation and repeat pelvic ultrasound in 12 weeks to assess for resolution.  Although the probability of neoplasm is considered low, the presence of multiple septations makes exclusion of neoplasm impossible.   Original Report Authenticated By: Andreas Newport, M.D.   US Pelvis Complete  06/11/2013   *RADIOLOGY REPORT*  Clinical Data: Hysterectomy.  Abdominal pain.  TRANSABDOMINAL AND TRANSVAGINAL ULTRASOUND OF PELVIS Technique:  Both transabdominal and transvaginal ultrasound examinations of the  pelvis were performed. Transabdominal technique was performed for global imaging of the pelvis including uterus, ovaries, adnexal regions, and pelvic cul-de-sac.  It was necessary to proceed with endovaginal exam following the transabdominal exam to visualize the ovaries bilaterally.  Comparison:  06/11/2013 CT.  Findings:  Uterus: Surgically absent.  Endometrium: Not applicable.  Right ovary:  53 mm x 20 mm x 38 mm.  Small cysts are present with septations.  The largest measures 26 mm x 10 mm x 31 mm. Septations appear relatively thin without convincing evidence of vascular flow. The sonographer notes multiple calcifications in the right ovary sonographically however the prior CT shows no calcifications and is more sensitive.  Left ovary: Poor visualization transvaginally.  Transabdominal approach was performed.  Transabdominal approach demonstrates a 52 mm x 49 mm x 45 mm cystic lesion without septations or solid component.  No internal vascular flow is identified.  Other findings: Small amount of free fluid in the anatomic pelvis.  Bilateral color flow to the ovaries is observed.  IMPRESSION: 1.  No acute abnormality. 2.  Bilateral ovarian cystic lesions, larger on the left than right as demonstrated on CT.  The septations are more worrisome in the right ovary.  The most reasonable follow-up would be nonemergent follow-up surgical consultation and repeat pelvic ultrasound in 12 weeks to assess for resolution.  Although the probability of neoplasm is considered low, the presence of multiple septations makes exclusion of neoplasm impossible.   Original Report Authenticated By: Andreas Newport, M.D.   Dg Abd Acute W/chest  06/11/2013   *RADIOLOGY REPORT*  Clinical Data: Recurrent abdominal pain  ACUTE ABDOMEN SERIES (ABDOMEN 2 VIEW & CHEST 1 VIEW)  Comparison: None.  Findings: Cardiomediastinal silhouette is unremarkable.  No acute infiltrate or pleural effusion.  No pulmonary edema.  There is nonspecific  nonobstructive bowel gas pattern.  Moderate stool noted in proximal colon.  Minimal gaseous distended small bowel loops in mid abdomen suspicious for mild ileus. Mild levoscoliosis lumbar spine. No free abdominal air.  IMPRESSION: No acute disease within chest. Moderate stool in the right colon. Nonobstructive bowel gas pattern.  Minimal gaseous distended small bowel loops mid abdomen suspicious for mild ileus.   Original Report Authenticated By: Natasha Mead, M.D.    Review of Systems  Constitutional: Positive for chills. Negative for fever.  HENT: Negative for nosebleeds and congestion.   Eyes: Negative for blurred vision and double vision.  Respiratory: Negative for shortness of breath.   Cardiovascular: Negative for chest pain, palpitations, orthopnea, leg swelling and PND.  Gastrointestinal: Positive for nausea, vomiting, abdominal pain and diarrhea. Negative for blood in stool and melena.  Genitourinary: Negative for dysuria and frequency.  Musculoskeletal: Negative for myalgias.  Neurological: Negative for dizziness, tingling, seizures, loss of consciousness, weakness and headaches.       Denies amaurosis fugax  Psychiatric/Behavioral: Negative for depression.    Blood pressure 169/78, pulse 76, temperature 98.8 F (37.1 C), temperature source Oral, resp. rate 15, SpO2 97.00%. Physical Exam  Vitals reviewed. Constitutional: She is oriented to person,  place, and time. She appears well-developed and well-nourished. No distress.  obese  HENT:  Head: Normocephalic and atraumatic.  Right Ear: External ear normal.  Left Ear: External ear normal.  Eyes: Conjunctivae and EOM are normal. No scleral icterus.  Neck: Neck supple. No tracheal deviation present. No thyromegaly present.  Cardiovascular: Normal rate, regular rhythm and normal heart sounds.   No murmur heard. Respiratory: Effort normal. No stridor. No respiratory distress. She has wheezes (mild inspiratory wheezes left side). She  has no rales. She exhibits no tenderness.  GI: Soft. Normal appearance. She exhibits no distension. There is tenderness in the right upper quadrant and epigastric area. There is positive Murphy's sign. There is no rigidity, no rebound and no guarding.    Musculoskeletal: She exhibits no edema and no tenderness.  Lymphadenopathy:    She has no cervical adenopathy.  Neurological: She is alert and oriented to person, place, and time.  Skin: Skin is warm and dry. No rash noted. She is not diaphoretic. No erythema.  Psychiatric: She has a normal mood and affect. Her behavior is normal. Judgment and thought content normal.     Assessment/Plan Acute cholecystitis Bilateral ovarian cyst Obesity Tobacco use Hypertension  She'll be admitted and placed on IV antibiotics. She will be made n.p.o. Sequential compression devices  will be placed. She will tentatively go to the operating room tomorrow for laparoscopic cholecystectomy possible open. I explained the findings of bilateral ovarian cysts of the patient and her husband. I explained that she will need outpatient follow up regarding this.  Mary Sella. Andrey Campanile, MD, FACS General, Bariatric, & Minimally Invasive Surgery [facility], [place]   [facility] M 06/12/2013, 12:43 AM

## 2013-06-12 NOTE — Progress Notes (Signed)
DC home with husband, Verbally understood DC instructions, no questions ask

## 2013-06-12 NOTE — Progress Notes (Signed)
Pt had a yellow and pink ring taped to right 4th finger during the case. Sent to PACU with ring still taped to 4th finger.

## 2013-06-12 NOTE — Anesthesia Postprocedure Evaluation (Signed)
Anesthesia Post Note  Patient: Kelsey Payne  Procedure(s) Performed: Procedure(s): LAPAROSCOPIC CHOLECYSTECTOMY  Anesthesia type: general  Patient location: PACU  Post pain: Pain level controlled  Post assessment: Patient's Cardiovascular Status Stable  Last Vitals:  Filed Vitals:   06/12/13 1045  BP: 149/85  Pulse: 62  Temp:   Resp: 16    Post vital signs: Reviewed and stable  Level of consciousness: sedated  Complications: No apparent anesthesia complications

## 2013-06-12 NOTE — ED Provider Notes (Signed)
Patient's CT scan reviewed, showing gallstones and a a pelvic mass.  Recommend ultrasound for definition.  These have been ordered. Ultrasound reveals multiple gallstones with gallbladder wall thickening and fluid surrounding the gallbladder.  Also, the pelvic ultrasound shows that she has multiple ovarian cysts with septations, which are concerning for a possible neoplasm.  This will need to be further evaluated by GYN  Arman Filter, NP 06/12/13 2019

## 2013-06-12 NOTE — Discharge Summary (Signed)
Patient ID: Britnie Colville MRN: [id number] DOB/AGE: 54-Sep-1962 54 y.o.  Admit date: 06/11/2013 Discharge date: 06/12/2013  Procedures: lap cholecystectomy  Consults: None  Reason for Admission:  52 year old obese Caucasian female comes to the ER because of persistent epigastric abdominal pain that started last night. It was associated with nausea and vomiting. She states for the past month she has been having intermittent episodes of epigastric pain generally occurring at night it lasts for about 6 hours at a time. She states the pain will be sharp and intense almost like a fist is clenching her insides. She hasn't found anything that relieves the pain. Her thoughts have been that it's been due to indigestion. She states that she came to the emergency department because the pain was persistent and did not resolve after it usually does. She denies any weight loss. She states that she's gained about 40 pounds over the past year. They have recently moved to [place]. She is adopted. She denies any melena or hematochezia. She states that she's had a several month history of loose stool. She denies any NSAID use. She denies any acholic stools. She denies any jaundice.  A CT scan of her abdomen and pelvis in addition to an abdominal ultrasound as well as pelvic ultrasound was then performed. She was found to have incidental bilateral ovarian cysts as well  Admission Diagnoses:  1. Acute cholecystitis 2. Bilateral ovarian cyst 3. Obesity 4. HTN  Hospital Course: The patient was admitted and started on IV abx therapy.  She was taken to the OR for a lap chole.  She tolerated this procedure well and on POD#0, she was tolerating a liquid diet and her pain was well controlled.  She was stable for dc home.  PE:Abd: soft, appropriately tender, incisions c/d/i  Discharge Diagnoses:  Principal Problem:   Acute cholecystitis Active Problems:   HTN (hypertension)   Bilateral ovarian cysts s/p lap  chole  Discharge Medications:   Medication List         ALEVE 220 MG tablet  Generic drug:  naproxen sodium  Take 440 mg by mouth 2 (two) times daily with a meal.     buPROPion 150 MG 24 hr tablet  Commonly known as:  WELLBUTRIN XL  Take 150 mg by mouth daily.     lisinopril-hydrochlorothiazide 20-12.5 MG per tablet  Commonly known as:  PRINZIDE,ZESTORETIC  Take 1 tablet by mouth daily.     oxyCODONE 5 MG immediate release tablet  Commonly known as:  Oxy IR/ROXICODONE  Take 1-2 tablets (5-10 mg total) by mouth every 4 (four) hours as needed (pain).     VITAMIN D (ERGOCALCIFEROL) PO  Take 1 tablet by mouth daily.        Discharge Instructions:     Follow-up Information   Follow up with [facility] On 07/03/2013. (10:45am, arrive at 10:15pm)    Contact information:   [address]   [place] [address] [phone]       Signed: Letha Cape 06/12/2013, 2:55 PM

## 2013-06-12 NOTE — Anesthesia Preprocedure Evaluation (Addendum)
Anesthesia Evaluation  Patient identified by MRN, date of birth, ID band Patient awake    Reviewed: Allergy & Precautions, H&P , NPO status , Patient's Chart, lab work & pertinent test results  Airway Mallampati: II TM Distance: >3 FB Neck ROM: Full    Dental  (+) Dental Advisory Given and Teeth Intact   Pulmonary Current Smoker,          Cardiovascular hypertension, Pt. on medications     Neuro/Psych PSYCHIATRIC DISORDERS Depression    GI/Hepatic GERD-  Medicated and Controlled,  Endo/Other    Renal/GU      Musculoskeletal   Abdominal   Peds  Hematology   Anesthesia Other Findings   Reproductive/Obstetrics                         Anesthesia Physical Anesthesia Plan  ASA: II  Anesthesia Plan: General   Post-op Pain Management:    Induction: Intravenous  Airway Management Planned: Oral ETT  Additional Equipment:   Intra-op Plan:   Post-operative Plan: Extubation in OR  Informed Consent: I have reviewed the patients History and Physical, chart, labs and discussed the procedure including the risks, benefits and alternatives for the proposed anesthesia with the patient or authorized representative who has indicated his/her understanding and acceptance.   Dental advisory given  Plan Discussed with: Surgeon and CRNA  Anesthesia Plan Comments:        Anesthesia Quick Evaluation

## 2013-06-12 NOTE — Transfer of Care (Signed)
Immediate Anesthesia Transfer of Care Note  Patient: Kelsey Payne  Procedure(s) Performed: Procedure(s): LAPAROSCOPIC CHOLECYSTECTOMY  Patient Location: PACU  Anesthesia Type:General  Level of Consciousness: awake, alert  and oriented  Airway & Oxygen Therapy: Patient Spontanous Breathing and Patient connected to nasal cannula oxygen  Post-op Assessment: Report given to PACU RN, Post -op Vital signs reviewed and stable and Patient moving all extremities X 4  Post vital signs: Reviewed and stable  Complications: No apparent anesthesia complications

## 2013-06-12 NOTE — Progress Notes (Signed)
Subjective: Pt asleep, pain is better.  Denies fever, chills.  Denies chest pains, shortness of breath or palpations.    Objective: Vital signs in last 24 hours: Temp:  [98.1 F (36.7 C)-98.8 F (37.1 C)] 98.5 F (36.9 C) (07/29 0544) Pulse Rate:  [65-76] 65 (07/29 0544) Resp:  [15-69] 17 (07/29 0544) BP: (151-176)/(77-90) 151/84 mmHg (07/29 0544) SpO2:  [93 %-99 %] 93 % (07/29 0544) Weight:  [119 lb 4.8 oz (54.114 kg)] 119 lb 4.8 oz (54.114 kg) (07/29 0151)    Intake/Output from previous day: 07/28 0701 - 07/29 0700 In: 412.5 [I.V.:362.5; IV Piggyback:50] Out: 300 [Urine:300] Intake/Output this shift:    General appearance: alert, cooperative, appears stated age and no distress Resp: clear to auscultation bilaterally Cardio: regular rate and rhythm, S1, S2 normal, no murmur, click, rub or gallop GI: +BS soft round and tender to LLQ and RUQ.  No masses or hernias Extremities: extremities normal, atraumatic, no cyanosis or edema  Lab Results:   Recent Labs  06/11/13 1400  WBC 12.8*  HGB 14.6  HCT 44.1  PLT 290   BMET  Recent Labs  06/11/13 1400 06/12/13 0420  NA 139 138  K 4.2 3.3*  CL 104 105  CO2 26 26  GLUCOSE 105* 111*  BUN 12 7  CREATININE 0.76 0.76  CALCIUM 9.4 8.2*   Studies/Results: Ct Abdomen Pelvis Wo Contrast  06/11/2013   *RADIOLOGY REPORT*  Clinical Data: Abdominal pain and cramping with associated vomiting.  CT ABDOMEN AND PELVIS WITHOUT CONTRAST  Technique:  Multidetector CT imaging of the abdomen and pelvis was performed following the standard protocol without intravenous contrast.  Comparison: None.  Findings: There is at least one calculus present in the neck of the gallbladder measuring roughly 1.7 cm.  The gallbladder does appear to be potentially mildly edematous and distended.  Correlation suggested with focal pain.  Ultrasound correlation may also be helpful.  The liver is unremarkable.  There is no evidence of biliary ductal  dilatation.  Unenhanced appearance of the pancreas, spleen, adrenal glands and kidneys are within normal limits.  In the pelvis, a complex cystic structures noted on the left measuring just under 5 cm in diameter.  Internal density is approximately 25 HU.  This could represent a complex cyst or mass of the left ovary.  The uterus has been removed and there appears to be a small right ovary in the lower pelvis.  Ultimate correlation with pelvic sonography would be helpful.  No evidence of enlarged lymph nodes or hernia.  No abnormal fluid collections seen.  Bony structures are unremarkable.  IMPRESSION:  1.  Cholelithiasis with at least one gallstone identified in the neck of the gallbladder.  The gallbladder wall appears potentially mildly edematous and inflamed.  Recommend correlation with focal tenderness.  Ultrasound may also be helpful to evaluate the gallbladder. 2.  5 cm complex cystic structure of the left pelvis, presumably originating from the left ovary / adnexa.  Neoplasm is not excluded.  Recommend correlation with pelvic ultrasound.   Original Report Authenticated By: Irish Lack, M.D.   US Abdomen Complete  06/11/2013   *RADIOLOGY REPORT*  Clinical Data:  Abdominal pain for 1 month.  Nausea vomiting.  COMPLETE ABDOMINAL ULTRASOUND  Comparison:  CT from the same day.  Findings:  Gallbladder:  Numerous echogenic, shadowing stones.  Gallbladder wall thickening up to 8 mm.  There is pericholecystic fluid or wall striation.  Negative sonographic Murphy's sign.  Common bile duct:  Measures  5 mm  Liver:  No focal lesion identified.  Within normal limits in parenchymal echogenicity.  IVC:  Appears normal by gray scale.  Pancreas:  No focal abnormality seen.  Spleen:  Measures 9.5 cm.  Right Kidney:  Measures 12 cm.  No focal abnormality.  Left Kidney:  Measures 11 cm.  No focal abnormality.  Abdominal aorta:  No aneurysm identified.  Maximal diameter 2.5 cm.  Critical Value/emergent results were called  by telephone at the time of interpretation on 06/11/2013 at 23 30 to Dr. Junious Silk, who verbally acknowledged these results.  IMPRESSION:  Cholelithiasis and gallbladder inflammatory changes suggest acute cholecystitis. Negative sonographic Murphy's sign, which may be related to narcotic administration.   Original Report Authenticated By: Tiburcio Pea   US Transvaginal Non-ob  06/11/2013   *RADIOLOGY REPORT*  Clinical Data: Hysterectomy.  Abdominal pain.  TRANSABDOMINAL AND TRANSVAGINAL ULTRASOUND OF PELVIS Technique:  Both transabdominal and transvaginal ultrasound examinations of the pelvis were performed. Transabdominal technique was performed for global imaging of the pelvis including uterus, ovaries, adnexal regions, and pelvic cul-de-sac.  It was necessary to proceed with endovaginal exam following the transabdominal exam to visualize the ovaries bilaterally.  Comparison:  06/11/2013 CT.  Findings:  Uterus: Surgically absent.  Endometrium: Not applicable.  Right ovary:  53 mm x 20 mm x 38 mm.  Small cysts are present with septations.  The largest measures 26 mm x 10 mm x 31 mm. Septations appear relatively thin without convincing evidence of vascular flow. The sonographer notes multiple calcifications in the right ovary sonographically however the prior CT shows no calcifications and is more sensitive.  Left ovary: Poor visualization transvaginally.  Transabdominal approach was performed.  Transabdominal approach demonstrates a 52 mm x 49 mm x 45 mm cystic lesion without septations or solid component.  No internal vascular flow is identified.  Other findings: Small amount of free fluid in the anatomic pelvis.  Bilateral color flow to the ovaries is observed.  IMPRESSION: 1.  No acute abnormality. 2.  Bilateral ovarian cystic lesions, larger on the left than right as demonstrated on CT.  The septations are more worrisome in the right ovary.  The most reasonable follow-up would be nonemergent follow-up surgical  consultation and repeat pelvic ultrasound in 12 weeks to assess for resolution.  Although the probability of neoplasm is considered low, the presence of multiple septations makes exclusion of neoplasm impossible.   Original Report Authenticated By: Andreas Newport, M.D.   US Pelvis Complete  06/11/2013   *RADIOLOGY REPORT*  Clinical Data: Hysterectomy.  Abdominal pain.  TRANSABDOMINAL AND TRANSVAGINAL ULTRASOUND OF PELVIS Technique:  Both transabdominal and transvaginal ultrasound examinations of the pelvis were performed. Transabdominal technique was performed for global imaging of the pelvis including uterus, ovaries, adnexal regions, and pelvic cul-de-sac.  It was necessary to proceed with endovaginal exam following the transabdominal exam to visualize the ovaries bilaterally.  Comparison:  06/11/2013 CT.  Findings:  Uterus: Surgically absent.  Endometrium: Not applicable.  Right ovary:  53 mm x 20 mm x 38 mm.  Small cysts are present with septations.  The largest measures 26 mm x 10 mm x 31 mm. Septations appear relatively thin without convincing evidence of vascular flow. The sonographer notes multiple calcifications in the right ovary sonographically however the prior CT shows no calcifications and is more sensitive.  Left ovary: Poor visualization transvaginally.  Transabdominal approach was performed.  Transabdominal approach demonstrates a 52 mm x 49 mm x 45 mm cystic lesion without  septations or solid component.  No internal vascular flow is identified.  Other findings: Small amount of free fluid in the anatomic pelvis.  Bilateral color flow to the ovaries is observed.  IMPRESSION: 1.  No acute abnormality. 2.  Bilateral ovarian cystic lesions, larger on the left than right as demonstrated on CT.  The septations are more worrisome in the right ovary.  The most reasonable follow-up would be nonemergent follow-up surgical consultation and repeat pelvic ultrasound in 12 weeks to assess for resolution.   Although the probability of neoplasm is considered low, the presence of multiple septations makes exclusion of neoplasm impossible.   Original Report Authenticated By: Andreas Newport, M.D.   Dg Abd Acute W/chest  06/11/2013   *RADIOLOGY REPORT*  Clinical Data: Recurrent abdominal pain  ACUTE ABDOMEN SERIES (ABDOMEN 2 VIEW & CHEST 1 VIEW)  Comparison: None.  Findings: Cardiomediastinal silhouette is unremarkable.  No acute infiltrate or pleural effusion.  No pulmonary edema.  There is nonspecific nonobstructive bowel gas pattern.  Moderate stool noted in proximal colon.  Minimal gaseous distended small bowel loops in mid abdomen suspicious for mild ileus. Mild levoscoliosis lumbar spine. No free abdominal air.  IMPRESSION: No acute disease within chest. Moderate stool in the right colon. Nonobstructive bowel gas pattern.  Minimal gaseous distended small bowel loops mid abdomen suspicious for mild ileus.   Original Report Authenticated By: Natasha Mead, M.D.    Anti-infectives: Anti-infectives   Start     Dose/Rate Route Frequency Ordered Stop   06/12/13 0145  piperacillin-tazobactam (ZOSYN) IVPB 3.375 g     3.375 g 12.5 mL/hr over 240 Minutes Intravenous 3 times per day 06/12/13 0144        Assessment/Plan: Acute cholecystitis -consent signed -zosyn q8h -NPO -Pain control and antiemetics -IVF -SCDs -0600 dose of heparin held, resume following surgery  Hypokalemia -supplement x1 dose -repeat BMP in AM   LOS: 1 day   Kelsey Payne ANP-BC  06/12/2013 7:43 AM

## 2013-06-12 NOTE — Progress Notes (Signed)
Agree Nela Bascom, MD, MPH, FACS Pager: [phone]  

## 2013-06-12 NOTE — Op Note (Signed)
06/11/2013 - 06/12/2013  10:37 AM  PATIENT:  Kelsey Payne  52 y.o. female  PRE-OPERATIVE DIAGNOSIS:  Cholecystitis  POST-OPERATIVE DIAGNOSIS:  Cholecystitis  PROCEDURE:  Procedure(s): LAPAROSCOPIC CHOLECYSTECTOMY  SURGEON:  Surgeon(s): Liz Malady, MD  PHYSICIAN ASSISTANT:   ASSISTANTS: none   ANESTHESIA:   local and general  EBL:  Total I/O In: 1200 [I.V.:1200] Out: 25 [Blood:25]  BLOOD ADMINISTERED:none  DRAINS: none   SPECIMEN:  Excision  DISPOSITION OF SPECIMEN:  PATHOLOGY  COUNTS:  YES  DICTATION: .Dragon Dictation Patient was admitted with acute cholecystitis. She is brought for cholecystectomy. Informed consent was obtained. She received intravenous antibiotics. She was brought to the operating room and general endotracheal anesthesia was a Optician, dispensing by the anesthesia staff. Her abdomen was prepped and draped in sterile fashion. We did a time out procedure. Infraumbilical region was infiltrated with local. Infraumbilical incision was made. Subcutaneous tissues were dissected down revealing the anterior fascia. This was divided sharply along the midline. The Peritoneal cavity was entered under direct vision.0 Vicryl pursestring suture was placed on the fascial opening. Hassan trocar was inserted. Abdomen was insufflated with carbon dioxide. Upon placing the camera, we discovered were large number adhesions from the umbilical port site. Next, optiview technique was used to place a 5 mm port in the right upper quadrant. We placed a second 5 mm port in the right upper quadrant direct vision. Local was used at each of these port sites. There are number of adhesions from omentum up to the abdominal wall near the umbilical region extending down lower. These were sharply taken down exposing our [facility] trocar.The remainder were left in place. Again only omentum was involved. Next the dome the gallbladder was grasped with the gallbladder was tensely distended. It was drained  with a nahzat drainage system. Bile was clear consistent with hydrops. Once the gallbladder was decompressed, the dome was retracted superior medially. The infundibulum was retracted inferolaterally. Dissection began laterally and progressed medially easily identifying the cystic duct. We continued until critical view was obtained between the cystic duct, the gallbladder, and the liver. Due to patient's iodine allergy we did not do a cholangiogram. 3 clips were placed proximally on the cystic duct and one was placed distally and it was divided. Further dissection revealed the cystic artery as a branch off a replaced right hepatic. The cystic artery was dissected around and clipped twice proximally and divided. Gallbladder was taken off the liver bed with Bovie cautery. We did encounter one small posterior branch of the cystic artery. This was clipped proximally and divided with cautery. Gallbladder was taken to [facility] off the liver bed with cautery. It was placed in an retrieval bag and removed from the infraumbilical port site. Liver bed was copiously irrigated. Hemostasis was ensured with cautery. Clips were inspected and remaining good position. Abdomen was irrigated and irrigation fluid returned clear. We rechecked the liver bed once it was dry. Ports were removed under direct vision, pneumoperitoneum was released. Informed local fascia was closed by tying the pursestring suture. All 4 wounds were copiously irrigated and the skin of each was closed with running 4 Vicryl followed by Dermabond. All counts were correct. Patient tolerated procedure well apparent complication and was taken recovery in stable condition.  PATIENT DISPOSITION:  PACU - hemodynamically stable.   Delay start of Pharmacological VTE agent (>24hrs) due to surgical blood loss or risk of bleeding:  no  Violeta Gelinas, MD, MPH, FACS Pager: [phone]  7/29/201410:37 AM

## 2013-06-12 NOTE — ED Notes (Signed)
MR KURT Linz CALLED AND UPDATED OF THE WIFE ROOM #

## 2013-06-12 NOTE — ED Notes (Signed)
1610  Kelsey Payne (Husband) [phone]  Call with room number once pt receives room on the floor

## 2013-06-12 NOTE — Discharge Summary (Signed)
Kenyon Eichelberger, MD, MPH, FACS Pager: [phone]  

## 2013-06-12 NOTE — Anesthesia Procedure Notes (Signed)
Procedure Name: Intubation Date/Time: 06/12/2013 9:00 AM Performed by: Elon Alas Pre-anesthesia Checklist: Patient identified, Timeout performed, Suction available, Emergency Drugs available and Patient being monitored Patient Re-evaluated:Patient Re-evaluated prior to inductionOxygen Delivery Method: Circle system utilized Preoxygenation: Pre-oxygenation with 100% oxygen Intubation Type: IV induction Ventilation: Mask ventilation without difficulty Laryngoscope Size: Mac and 3 Grade View: Grade II Tube type: Oral Tube size: 7.0 mm Number of attempts: 1 Airway Equipment and Method: Stylet Placement Confirmation: positive ETCO2,  ETT inserted through vocal cords under direct vision and breath sounds checked- equal and bilateral Secured at: 21 cm Tube secured with: Tape Dental Injury: Teeth and Oropharynx as per pre-operative assessment

## 2013-06-13 NOTE — ED Provider Notes (Signed)
Please see the initial evaluation notes for documentation.  In essence, this patient presents with abdominal pain, had imaging pending on signout.  Gerhard Munch, MD 06/13/13 [phone]

## 2013-06-13 NOTE — ED Provider Notes (Signed)
I saw and evaluated the patient with the resident physician.  Documentation is appropriate and accurate.  The patient's description of new abdominal pain, she had multiple studies performed.  These were pending on signout.  Gerhard Munch, MD 06/13/13 [phone]

## 2013-06-15 ENCOUNTER — Encounter (HOSPITAL_COMMUNITY): Payer: Self-pay | Admitting: General Surgery

## 2013-07-03 ENCOUNTER — Ambulatory Visit (INDEPENDENT_AMBULATORY_CARE_PROVIDER_SITE_OTHER): Payer: BC Managed Care – PPO | Admitting: Internal Medicine

## 2013-07-03 ENCOUNTER — Encounter (INDEPENDENT_AMBULATORY_CARE_PROVIDER_SITE_OTHER): Payer: Self-pay | Admitting: Internal Medicine

## 2013-07-03 VITALS — BP 132/70 | HR 72 | Temp 98.1°F | Resp 15 | Ht 67.0 in | Wt 213.8 lb

## 2013-07-03 DIAGNOSIS — K810 Acute cholecystitis: Secondary | ICD-10-CM

## 2013-07-03 DIAGNOSIS — K8020 Calculus of gallbladder without cholecystitis without obstruction: Secondary | ICD-10-CM

## 2013-07-03 NOTE — Patient Instructions (Signed)
May resume regular activity without restrictions. Follow up as needed. Call with questions or concerns.  

## 2013-07-03 NOTE — Progress Notes (Signed)
  Subjective: Pt returns to the clinic today after undergoing laparoscopic cholecystectomy on 06/12/13 by Dr. Janee Morn.  The patient is tolerating their diet well and is having no severe pain.  Bowel function is good.  No problems with the wounds.  Objective: Vital signs in last 24 hours: Reviewed  PE: Abd: soft, non-tender, +bs, incisions well healed  Lab Results:  No results found for this basename: WBC, HGB, HCT, PLT,  in the last 72 hours BMET No results found for this basename: NA, K, CL, CO2, GLUCOSE, BUN, CREATININE, CALCIUM,  in the last 72 hours PT/INR No results found for this basename: LABPROT, INR,  in the last 72 hours CMP     Component Value Date/Time   NA 138 06/12/2013 0420   K 3.3* 06/12/2013 0420   CL 105 06/12/2013 0420   CO2 26 06/12/2013 0420   GLUCOSE 111* 06/12/2013 0420   BUN 7 06/12/2013 0420   CREATININE 0.76 06/12/2013 0420   CALCIUM 8.2* 06/12/2013 0420   PROT 6.0 06/12/2013 0420   ALBUMIN 3.1* 06/12/2013 0420   AST 13 06/12/2013 0420   ALT 12 06/12/2013 0420   ALKPHOS 59 06/12/2013 0420   BILITOT 0.3 06/12/2013 0420   GFRNONAA >90 06/12/2013 0420   GFRAA >90 06/12/2013 0420   Lipase     Component Value Date/Time   LIPASE 38 06/11/2013 1400       Studies/Results: No results found.  Anti-infectives: Anti-infectives   None       Assessment/Plan  1.  S/P Laparoscopic Cholecystectomy: doing well, may resume regular activity without restrictions, Pt will follow up with [place] PRN and knows to call with questions or concerns.     Kelsey Payne 07/03/2013

## 2013-08-14 ENCOUNTER — Encounter (INDEPENDENT_AMBULATORY_CARE_PROVIDER_SITE_OTHER): Payer: Self-pay

## 2014-01-16 ENCOUNTER — Other Ambulatory Visit: Payer: Self-pay | Admitting: Orthopaedic Surgery

## 2014-01-16 DIAGNOSIS — M25551 Pain in right hip: Secondary | ICD-10-CM

## 2014-01-23 ENCOUNTER — Ambulatory Visit
Admission: RE | Admit: 2014-01-23 | Discharge: 2014-01-23 | Disposition: A | Discharge status: Home / Self Care | Payer: BC Managed Care – PPO | Source: Ambulatory Visit | Attending: Orthopaedic Surgery | Admitting: Orthopaedic Surgery

## 2014-01-23 DIAGNOSIS — M25551 Pain in right hip: Secondary | ICD-10-CM

## 2014-01-23 MED ORDER — IOHEXOL 180 MG/ML  SOLN
15.0000 mL | Freq: Once | INTRAMUSCULAR | Status: AC | PRN
  Administered 2014-01-23: 15 mL via INTRA_ARTICULAR

## 2017-08-01 ENCOUNTER — Ambulatory Visit (INDEPENDENT_AMBULATORY_CARE_PROVIDER_SITE_OTHER): Payer: Self-pay | Admitting: Orthopaedic Surgery

## 2017-09-07 ENCOUNTER — Ambulatory Visit (INDEPENDENT_AMBULATORY_CARE_PROVIDER_SITE_OTHER): Payer: BC Managed Care – PPO | Admitting: Orthopaedic Surgery

## 2017-09-07 ENCOUNTER — Ambulatory Visit (INDEPENDENT_AMBULATORY_CARE_PROVIDER_SITE_OTHER): Payer: BC Managed Care – PPO

## 2017-09-07 DIAGNOSIS — M1611 Unilateral primary osteoarthritis, right hip: Secondary | ICD-10-CM | POA: Diagnosis not present

## 2017-09-07 DIAGNOSIS — M25551 Pain in right hip: Secondary | ICD-10-CM

## 2017-09-07 NOTE — Progress Notes (Signed)
Office Visit Note   Patient: Kelsey Payne           Date of Birth: 30-May-1961           MRN: [id number] Visit Date: 09/07/2017              Requested by: No referring provider defined for this encounter. PCP: Patient, No Pcp Per   Assessment & Plan: Visit Diagnoses:  1. Pain in right hip   2. Unilateral primary osteoarthritis, right hip     Plan: At this point I agree with proceeding with a total hip arthroplasty.  This is a process that has been affecting her worsening now 3-5 years.  With the failure of all conservative treatment measures and the worsening of her arthritis on even x-rays there is nothing else that we can recommend other than hip replacement surgery and she does wish to have this done as well.  We had a long thorough discussion today about hip replacement surgery including discussion of the risks and benefits of the surgery in order intraoperative and postoperative course will be involved.  All questions were encouraged and answered.  We will work on getting his scheduled surgery and then see her back at 2 weeks postoperative.  Follow-Up Instructions: Return for 2 weeks post-op.   Orders:  Orders Placed This Encounter  Procedures  . XR HIP UNILAT W OR W/O PELVIS 1V RIGHT   No orders of the defined types were placed in this encounter.     Procedures: No procedures performed   Clinical Data: No additional findings.   Subjective: No chief complaint on file. The patient is somewhat of seen now for over 3 years.  She has known debilitating arthritis of her right hip.  She has tried and failed all forms conservative treatment.  She is now admitted with a cane.  She has had hip strengthening exercises.  She is working on activity modification and weight loss.  She has had intra-articular steroid injection as well.  Obtain an MRI of her hip in 2015 that showed severe arthritis.  She things are gotten worse.  Her pain is daily and is detrimentally affect directed  daily living, quality of life, and mobility.  It is in her right hip in the groin area.  Nothing is helping at this point including anti-inflammatories and activity modification and rest.  She works mainly a Network engineer job.  This is slowing her down significantly.  Her pain is now 10 out of 10.  She currently denies any headache, chest pain, shortness of breath, fever, chills, nausea, vomiting.  She is not a diabetic.  HPI  Review of Systems  See above Objective: Vital Signs: There were no vitals taken for this visit.  Physical Exam She is alert and oriented x3 and in no acute distress Ortho Exam Examination of her left hip is normal.  Examination of her right hip shows severe pain in the groin with limitations of internal/external rotation.  Bilateral knee exams and back exam are normal. Specialty Comments:  No specialty comments available.  Imaging: Xr Hip Unilat W Or W/o Pelvis 1v Right  Result Date: 09/07/2017 AP pelvis and lateral right hip show severe end-stage arthritis.  There is complete loss of joint space with sclerotic changes in the femoral head and the acetabulum.  There is significant periarticular osteophytes.    PMFS History: Patient Active Problem List   Diagnosis Date Noted  . Acute cholecystitis 06/12/2013  . HTN (hypertension) 06/12/2013  .  Bilateral ovarian cysts 06/12/2013   Past Medical History:  Diagnosis Date  . Acid reflux   . Depression   . Hypertension     Family History  Problem Relation Age of Onset  . Adopted: Yes    Past Surgical History:  Procedure Laterality Date  . ABDOMINAL HYSTERECTOMY    . CHOLECYSTECTOMY  06/12/2013   Procedure: LAPAROSCOPIC CHOLECYSTECTOMY;  Surgeon: Zenovia Jarred, MD;  Location: [facility];  Service: General;;   Social History   Occupational History  . Not on file.   Social History Main Topics  . Smoking status: Current Every Day Smoker    Years: 36.00    Types: Cigarettes  . Smokeless tobacco: Current User  .  Alcohol use Yes  . Drug use: Yes     Comment: Occasional marijuana  . Sexual activity: Not on file

## 2017-09-19 ENCOUNTER — Other Ambulatory Visit (INDEPENDENT_AMBULATORY_CARE_PROVIDER_SITE_OTHER): Payer: Self-pay

## 2017-09-27 ENCOUNTER — Other Ambulatory Visit (INDEPENDENT_AMBULATORY_CARE_PROVIDER_SITE_OTHER): Payer: Self-pay | Admitting: Physician Assistant

## 2017-09-27 NOTE — Progress Notes (Signed)
Please place orders in Epic as patient is being scheduled for a pre-op appointment! Thank you! 

## 2017-09-27 NOTE — Patient Instructions (Signed)
Kelsey Payne  09/27/2017   Your procedure is scheduled on: 10-07-17  Report to [facility] Main  Entrance .              Report to admitting at     0630 AM   Call this number if you have problems the morning of surgery  [phone]   Remember: ONLY 1 PERSON MAY GO WITH YOU TO SHORT STAY TO GET  READY MORNING OF [place].  Do not eat food or drink liquids :After Midnight.     Take these medicines the morning of surgery with A SIP OF WATER:                                 You may not have any metal on your body including hair pins and              piercings  Do not wear jewelry, make-up, lotions, powders or perfumes, deodorant             Do not wear nail polish.  Do not shave  48 hours prior to surgery.             Do not bring valuables to the hospital. [place].  Contacts, dentures or bridgework may not be worn into surgery.  Leave suitcase in the car. After surgery it may be brought to your room.                Please read over the following fact sheets you were given: _____________________________________________________________________          [facility] - Preparing for Surgery Before surgery, you can play an important role.  Because skin is not sterile, your skin needs to be as free of germs as possible.  You can reduce the number of germs on your skin by washing with CHG (chlorahexidine gluconate) soap before surgery.  CHG is an antiseptic cleaner which kills germs and bonds with the skin to continue killing germs even after washing. Please DO NOT use if you have an allergy to CHG or antibacterial soaps.  If your skin becomes reddened/irritated stop using the CHG and inform your nurse when you arrive at Short Stay. Do not shave (including legs and underarms) for at least 48 hours prior to the first CHG shower.  You may shave your face/neck. Please follow these instructions carefully:  1.   Shower with CHG Soap the night before surgery and the  morning of Surgery.  2.  If you choose to wash your hair, wash your hair first as usual with your  normal  shampoo.  3.  After you shampoo, rinse your hair and body thoroughly to remove the  shampoo.                           4.  Use CHG as you would any other liquid soap.  You can apply chg directly  to the skin and wash                       Gently with a scrungie or clean washcloth.  5.  Apply the CHG Soap to your body  ONLY FROM THE NECK DOWN.   Do not use on face/ open                           Wound or open sores. Avoid contact with eyes, ears mouth and genitals (private parts).                       Wash face,  Genitals (private parts) with your normal soap.             6.  Wash thoroughly, paying special attention to the area where your surgery  will be performed.  7.  Thoroughly rinse your body with warm water from the neck down.  8.  DO NOT shower/wash with your normal soap after using and rinsing off  the CHG Soap.                9.  Pat yourself dry with a clean towel.            10.  Wear clean pajamas.            11.  Place clean sheets on your bed the night of your first shower and do not  sleep with pets. Day of Surgery : Do not apply any lotions/deodorants the morning of surgery.  Please wear clean clothes to the hospital/surgery center.  FAILURE TO FOLLOW THESE INSTRUCTIONS MAY RESULT IN THE CANCELLATION OF YOUR SURGERY PATIENT SIGNATURE_________________________________  NURSE SIGNATURE__________________________________  ________________________________________________________________________   Adam Phenix  An incentive spirometer is a tool that can help keep your lungs clear and active. This tool measures how well you are filling your lungs with each breath. Taking long deep breaths may help reverse or decrease the chance of developing breathing (pulmonary) problems (especially infection) following:  A long  period of time when you are unable to move or be active. BEFORE THE PROCEDURE   If the spirometer includes an indicator to show your best effort, your nurse or respiratory therapist will set it to a desired goal.  If possible, sit up straight or lean slightly forward. Try not to slouch.  Hold the incentive spirometer in an upright position. INSTRUCTIONS FOR USE  1. Sit on the edge of your bed if possible, or sit up as far as you can in bed or on a chair. 2. Hold the incentive spirometer in an upright position. 3. Breathe out normally. 4. Place the mouthpiece in your mouth and seal your lips tightly around it. 5. Breathe in slowly and as deeply as possible, raising the piston or the ball toward the top of the column. 6. Hold your breath for 3-5 seconds or for as long as possible. Allow the piston or ball to fall to the bottom of the column. 7. Remove the mouthpiece from your mouth and breathe out normally. 8. Rest for a few seconds and repeat Steps 1 through 7 at least 10 times every 1-2 hours when you are awake. Take your time and take a few normal breaths between deep breaths. 9. The spirometer may include an indicator to show your best effort. Use the indicator as a goal to work toward during each repetition. 10. After each set of 10 deep breaths, practice coughing to be sure your lungs are clear. If you have an incision (the cut made at the time of surgery), support your incision when coughing by placing a pillow or rolled up towels firmly against it. Once  you are able to get out of bed, walk around indoors and cough well. You may stop using the incentive spirometer when instructed by your caregiver.  RISKS AND COMPLICATIONS  Take your time so you do not get dizzy or light-headed.  If you are in pain, you may need to take or ask for pain medication before doing incentive spirometry. It is harder to take a deep breath if you are having pain. AFTER USE  Rest and breathe slowly and  easily.  It can be helpful to keep track of a log of your progress. Your caregiver can provide you with a simple table to help with this. If you are using the spirometer at home, follow these instructions: [place] IF:   You are having difficultly using the spirometer.  You have trouble using the spirometer as often as instructed.  Your pain medication is not giving enough relief while using the spirometer.  You develop fever of 100.5 F (38.1 C) or higher. SEEK IMMEDIATE MEDICAL CARE IF:   You cough up bloody sputum that had not been present before.  You develop fever of 102 F (38.9 C) or greater.  You develop worsening pain at or near the incision site. MAKE SURE YOU:   Understand these instructions.  Will watch your condition.  Will get help right away if you are not doing well or get worse. Document Released: 03/14/2007 Document Revised: 01/24/2012 Document Reviewed: 05/15/2007 [facility] Patient Information 2014 [place], [place].   ________________________________________________________________________

## 2017-09-27 NOTE — Progress Notes (Signed)
Please place orders in Epic as patient has a pre-op appointment on 09/28/2017! Thank you!

## 2017-09-28 ENCOUNTER — Encounter (HOSPITAL_COMMUNITY)
Admission: RE | Admit: 2017-09-28 | Discharge: 2017-09-28 | Disposition: A | Discharge status: Home / Self Care | Payer: BC Managed Care – PPO | Source: Ambulatory Visit | Attending: Orthopaedic Surgery | Admitting: Orthopaedic Surgery

## 2017-09-28 ENCOUNTER — Encounter (HOSPITAL_COMMUNITY): Payer: Self-pay

## 2017-09-28 ENCOUNTER — Other Ambulatory Visit: Payer: Self-pay

## 2017-09-28 DIAGNOSIS — M1611 Unilateral primary osteoarthritis, right hip: Secondary | ICD-10-CM | POA: Diagnosis not present

## 2017-09-28 DIAGNOSIS — Z01810 Encounter for preprocedural cardiovascular examination: Secondary | ICD-10-CM | POA: Diagnosis present

## 2017-09-28 DIAGNOSIS — I4510 Unspecified right bundle-branch block: Secondary | ICD-10-CM | POA: Insufficient documentation

## 2017-09-28 DIAGNOSIS — Z01812 Encounter for preprocedural laboratory examination: Secondary | ICD-10-CM | POA: Diagnosis present

## 2017-09-28 HISTORY — DX: Malignant (primary) neoplasm, unspecified: C80.1

## 2017-09-28 HISTORY — DX: Unspecified osteoarthritis, unspecified site: M19.90

## 2017-09-28 HISTORY — DX: Anxiety disorder, unspecified: F41.9

## 2017-09-28 LAB — BASIC METABOLIC PANEL
Anion gap: 9 (ref 5–15)
BUN: 18 mg/dL (ref 6–20)
CHLORIDE: 105 mmol/L (ref 101–111)
CO2: 25 mmol/L (ref 22–32)
CREATININE: 0.76 mg/dL (ref 0.44–1.00)
Calcium: 9.3 mg/dL (ref 8.9–10.3)
GFR calc Af Amer: >60 mL/min (ref >60)
GFR calc non Af Amer: >60 mL/min (ref >60)
GLUCOSE: 94 mg/dL (ref 65–99)
POTASSIUM: 3.9 mmol/L (ref 3.5–5.1)
Sodium: 139 mmol/L (ref 135–145)

## 2017-09-28 LAB — SURGICAL PCR SCREEN
MRSA, PCR: NEGATIVE
Staphylococcus aureus: NEGATIVE

## 2017-09-28 LAB — CBC
HEMATOCRIT: 43.1 % (ref 36.0–46.0)
HEMOGLOBIN: 14.3 g/dL (ref 12.0–15.0)
MCH: 29.7 pg (ref 26.0–34.0)
MCHC: 33.2 g/dL (ref 30.0–36.0)
MCV: 89.6 fL (ref 78.0–100.0)
Platelets: 286 10*3/uL (ref 150–400)
RBC: 4.81 MIL/uL (ref 3.87–5.11)
RDW: 13.8 % (ref 11.5–15.5)
WBC: 9.8 10*3/uL (ref 4.0–10.5)

## 2017-09-28 LAB — ABO/RH: ABO/RH(D): A POS

## 2017-10-05 NOTE — Anesthesia Preprocedure Evaluation (Addendum)
Anesthesia Evaluation  Patient identified by MRN, date of birth, ID band Patient awake    Reviewed: Allergy & Precautions, NPO status , Patient's Chart, lab work & pertinent test results  History of Anesthesia Complications Negative for: history of anesthetic complications  Airway Mallampati: II  TM Distance: >3 FB Neck ROM: Full    Dental no notable dental hx. (+) Dental Advisory Given   Pulmonary former smoker,    Pulmonary exam normal        Cardiovascular hypertension, Pt. on medications Normal cardiovascular exam     Neuro/Psych PSYCHIATRIC DISORDERS Anxiety Depression    GI/Hepatic negative GI ROS, Neg liver ROS,   Endo/Other  negative endocrine ROS  Renal/GU negative Renal ROS     Musculoskeletal  (+) Arthritis ,   Abdominal   Peds  Hematology   Anesthesia Other Findings   Reproductive/Obstetrics                            Anesthesia Physical Anesthesia Plan  ASA: III  Anesthesia Plan: MAC and Spinal   Post-op Pain Management:    Induction:   PONV Risk Score and Plan: 2 and Ondansetron and Propofol infusion  Airway Management Planned: Natural Airway and Simple Face Mask  Additional Equipment:   Intra-op Plan:   Post-operative Plan:   Informed Consent: I have reviewed the patients History and Physical, chart, labs and discussed the procedure including the risks, benefits and alternatives for the proposed anesthesia with the patient or authorized representative who has indicated his/her understanding and acceptance.   Dental advisory given  Plan Discussed with: CRNA, Anesthesiologist and Surgeon  Anesthesia Plan Comments:        Anesthesia Quick Evaluation

## 2017-10-07 ENCOUNTER — Inpatient Hospital Stay (HOSPITAL_COMMUNITY)
Admission: RE | Admit: 2017-10-07 | Discharge: 2017-10-08 | DRG: 470 | Disposition: A | Discharge status: Home Health | Payer: BC Managed Care – PPO | Source: Ambulatory Visit | Attending: Orthopaedic Surgery | Admitting: Orthopaedic Surgery

## 2017-10-07 ENCOUNTER — Inpatient Hospital Stay (HOSPITAL_COMMUNITY): Payer: BC Managed Care – PPO

## 2017-10-07 ENCOUNTER — Other Ambulatory Visit: Payer: Self-pay

## 2017-10-07 ENCOUNTER — Inpatient Hospital Stay (HOSPITAL_COMMUNITY): Payer: BC Managed Care – PPO | Admitting: Anesthesiology

## 2017-10-07 ENCOUNTER — Encounter (HOSPITAL_COMMUNITY)
Admission: RE | Disposition: A | Discharge status: Home Health | Payer: Self-pay | Source: Ambulatory Visit | Attending: Orthopaedic Surgery

## 2017-10-07 ENCOUNTER — Encounter (HOSPITAL_COMMUNITY): Payer: Self-pay | Admitting: *Deleted

## 2017-10-07 DIAGNOSIS — Z87891 Personal history of nicotine dependence: Secondary | ICD-10-CM | POA: Diagnosis not present

## 2017-10-07 DIAGNOSIS — Z419 Encounter for procedure for purposes other than remedying health state, unspecified: Secondary | ICD-10-CM

## 2017-10-07 DIAGNOSIS — M25551 Pain in right hip: Secondary | ICD-10-CM | POA: Diagnosis present

## 2017-10-07 DIAGNOSIS — F419 Anxiety disorder, unspecified: Secondary | ICD-10-CM | POA: Diagnosis present

## 2017-10-07 DIAGNOSIS — M1611 Unilateral primary osteoarthritis, right hip: Secondary | ICD-10-CM | POA: Diagnosis present

## 2017-10-07 DIAGNOSIS — Z96641 Presence of right artificial hip joint: Secondary | ICD-10-CM

## 2017-10-07 DIAGNOSIS — Z79899 Other long term (current) drug therapy: Secondary | ICD-10-CM

## 2017-10-07 DIAGNOSIS — I10 Essential (primary) hypertension: Secondary | ICD-10-CM | POA: Diagnosis present

## 2017-10-07 DIAGNOSIS — Z85828 Personal history of other malignant neoplasm of skin: Secondary | ICD-10-CM | POA: Diagnosis not present

## 2017-10-07 DIAGNOSIS — F329 Major depressive disorder, single episode, unspecified: Secondary | ICD-10-CM | POA: Diagnosis present

## 2017-10-07 HISTORY — PX: TOTAL HIP ARTHROPLASTY: SHX124

## 2017-10-07 LAB — TYPE AND SCREEN
ABO/RH(D): A POS
Antibody Screen: NEGATIVE

## 2017-10-07 SURGERY — ARTHROPLASTY, HIP, TOTAL, ANTERIOR APPROACH
Anesthesia: Monitor Anesthesia Care | Site: Hip | Laterality: Right

## 2017-10-07 MED ORDER — FENTANYL CITRATE (PF) 100 MCG/2ML IJ SOLN
INTRAMUSCULAR | Status: DC | PRN
  Administered 2017-10-07 (×2): 50 ug via INTRAVENOUS

## 2017-10-07 MED ORDER — PROMETHAZINE HCL 25 MG/ML IJ SOLN: 6.2500 mg | INTRAMUSCULAR | Status: DC | PRN

## 2017-10-07 MED ORDER — MENTHOL 3 MG MT LOZG: 1.0000 | LOZENGE | OROMUCOSAL | Status: DC | PRN

## 2017-10-07 MED ORDER — DEXAMETHASONE SODIUM PHOSPHATE 10 MG/ML IJ SOLN
INTRAMUSCULAR | Status: DC | PRN
  Administered 2017-10-07: 10 mg via INTRAVENOUS

## 2017-10-07 MED ORDER — METOCLOPRAMIDE HCL 5 MG/ML IJ SOLN: 5.0000 mg | Freq: Three times a day (TID) | INTRAMUSCULAR | Status: DC | PRN

## 2017-10-07 MED ORDER — DEXAMETHASONE SODIUM PHOSPHATE 10 MG/ML IJ SOLN
INTRAMUSCULAR | Status: AC
  Filled 2017-10-07: qty 1

## 2017-10-07 MED ORDER — SODIUM CHLORIDE 0.9 % IV SOLN
1000.0000 mg | INTRAVENOUS | Status: AC
  Administered 2017-10-07: 1000 mg via INTRAVENOUS
  Filled 2017-10-07: qty 1100

## 2017-10-07 MED ORDER — FENTANYL CITRATE (PF) 100 MCG/2ML IJ SOLN: 25.0000 ug | INTRAMUSCULAR | Status: DC | PRN

## 2017-10-07 MED ORDER — ONDANSETRON HCL 4 MG/2ML IJ SOLN
INTRAMUSCULAR | Status: AC
  Filled 2017-10-07: qty 2

## 2017-10-07 MED ORDER — ONDANSETRON HCL 4 MG/2ML IJ SOLN
INTRAMUSCULAR | Status: DC | PRN
  Administered 2017-10-07: 4 mg via INTRAVENOUS

## 2017-10-07 MED ORDER — PROPOFOL 10 MG/ML IV BOLUS
INTRAVENOUS | Status: AC
  Filled 2017-10-07: qty 20

## 2017-10-07 MED ORDER — MIDAZOLAM HCL 5 MG/5ML IJ SOLN
INTRAMUSCULAR | Status: DC | PRN
  Administered 2017-10-07: 2 mg via INTRAVENOUS

## 2017-10-07 MED ORDER — METHOCARBAMOL 500 MG PO TABS
500.0000 mg | ORAL_TABLET | Freq: Four times a day (QID) | ORAL | Status: DC | PRN
  Administered 2017-10-07 – 2017-10-08 (×2): 500 mg via ORAL
  Filled 2017-10-07 (×3): qty 1

## 2017-10-07 MED ORDER — EPHEDRINE SULFATE-NACL 50-0.9 MG/10ML-% IV SOSY
PREFILLED_SYRINGE | INTRAVENOUS | Status: DC | PRN
  Administered 2017-10-07: 10 mg via INTRAVENOUS
  Administered 2017-10-07 (×2): 5 mg via INTRAVENOUS

## 2017-10-07 MED ORDER — ONDANSETRON HCL 4 MG/2ML IJ SOLN: 4.0000 mg | Freq: Four times a day (QID) | INTRAMUSCULAR | Status: DC | PRN

## 2017-10-07 MED ORDER — SERTRALINE HCL 50 MG PO TABS
50.0000 mg | ORAL_TABLET | Freq: Every day | ORAL | Status: DC
Start: 2017-10-08 — End: 2017-10-08
  Administered 2017-10-08: 09:00:00 50 mg via ORAL
  Filled 2017-10-07: qty 1

## 2017-10-07 MED ORDER — BUPIVACAINE IN DEXTROSE 0.75-8.25 % IT SOLN
INTRATHECAL | Status: DC | PRN
  Administered 2017-10-07: 15 mg via INTRATHECAL

## 2017-10-07 MED ORDER — CEFAZOLIN SODIUM-DEXTROSE 2-4 GM/100ML-% IV SOLN
INTRAVENOUS | Status: AC
  Filled 2017-10-07: qty 100

## 2017-10-07 MED ORDER — ACETAMINOPHEN 325 MG PO TABS: 650.0000 mg | ORAL_TABLET | ORAL | Status: DC | PRN

## 2017-10-07 MED ORDER — DOCUSATE SODIUM 100 MG PO CAPS
100.0000 mg | ORAL_CAPSULE | Freq: Two times a day (BID) | ORAL | Status: DC
  Administered 2017-10-07 – 2017-10-08 (×2): 100 mg via ORAL
  Filled 2017-10-07 (×2): qty 1

## 2017-10-07 MED ORDER — ACETAMINOPHEN 650 MG RE SUPP: 650.0000 mg | RECTAL | Status: DC | PRN

## 2017-10-07 MED ORDER — ZOLPIDEM TARTRATE 5 MG PO TABS: 5.0000 mg | ORAL_TABLET | Freq: Every evening | ORAL | Status: DC | PRN

## 2017-10-07 MED ORDER — SODIUM CHLORIDE 0.9 % IR SOLN
Status: DC | PRN
  Administered 2017-10-07: 1000 mL

## 2017-10-07 MED ORDER — CEFAZOLIN SODIUM-DEXTROSE 2-4 GM/100ML-% IV SOLN
2.0000 g | INTRAVENOUS | Status: AC
  Administered 2017-10-07: 2 g via INTRAVENOUS

## 2017-10-07 MED ORDER — ALUM & MAG HYDROXIDE-SIMETH 200-200-20 MG/5ML PO SUSP: 30.0000 mL | ORAL | Status: DC | PRN

## 2017-10-07 MED ORDER — ONDANSETRON HCL 4 MG PO TABS: 4.0000 mg | ORAL_TABLET | Freq: Four times a day (QID) | ORAL | Status: DC | PRN

## 2017-10-07 MED ORDER — PROPOFOL 500 MG/50ML IV EMUL
INTRAVENOUS | Status: DC | PRN
  Administered 2017-10-07: 40 ug/kg/min via INTRAVENOUS

## 2017-10-07 MED ORDER — CHLORHEXIDINE GLUCONATE 4 % EX LIQD: 60.0000 mL | Freq: Once | CUTANEOUS | Status: DC

## 2017-10-07 MED ORDER — HYDROCODONE-ACETAMINOPHEN 5-325 MG PO TABS
1.0000 | ORAL_TABLET | ORAL | Status: DC | PRN
  Administered 2017-10-07 – 2017-10-08 (×2): 1 via ORAL
  Administered 2017-10-08: 09:00:00 2 via ORAL
  Filled 2017-10-07: qty 1
  Filled 2017-10-07: qty 2
  Filled 2017-10-07: qty 1

## 2017-10-07 MED ORDER — LACTATED RINGERS IV SOLN
INTRAVENOUS | Status: DC
  Administered 2017-10-07: 07:00:00 via INTRAVENOUS

## 2017-10-07 MED ORDER — METHOCARBAMOL 1000 MG/10ML IJ SOLN
500.0000 mg | Freq: Four times a day (QID) | INTRAVENOUS | Status: DC | PRN
  Administered 2017-10-07: 500 mg via INTRAVENOUS
  Filled 2017-10-07: qty 550

## 2017-10-07 MED ORDER — METOCLOPRAMIDE HCL 5 MG PO TABS: 5.0000 mg | ORAL_TABLET | Freq: Three times a day (TID) | ORAL | Status: DC | PRN

## 2017-10-07 MED ORDER — EPHEDRINE 5 MG/ML INJ
INTRAVENOUS | Status: AC
  Filled 2017-10-07: qty 10

## 2017-10-07 MED ORDER — LIDOCAINE 2% (20 MG/ML) 5 ML SYRINGE
INTRAMUSCULAR | Status: AC
  Filled 2017-10-07: qty 5

## 2017-10-07 MED ORDER — PHENOL 1.4 % MT LIQD: 1.0000 | OROMUCOSAL | Status: DC | PRN

## 2017-10-07 MED ORDER — AMLODIPINE BESYLATE 10 MG PO TABS
10.0000 mg | ORAL_TABLET | Freq: Every day | ORAL | Status: DC
  Administered 2017-10-08: 09:00:00 10 mg via ORAL
  Filled 2017-10-07: qty 1

## 2017-10-07 MED ORDER — BUPROPION HCL ER (XL) 300 MG PO TB24
300.0000 mg | ORAL_TABLET | Freq: Every day | ORAL | Status: DC
  Administered 2017-10-08: 09:00:00 300 mg via ORAL
  Filled 2017-10-07 (×2): qty 1

## 2017-10-07 MED ORDER — CEFAZOLIN SODIUM-DEXTROSE 1-4 GM/50ML-% IV SOLN
1.0000 g | Freq: Four times a day (QID) | INTRAVENOUS | Status: AC
  Administered 2017-10-07 (×2): 1 g via INTRAVENOUS
  Filled 2017-10-07 (×2): qty 50

## 2017-10-07 MED ORDER — STERILE WATER FOR IRRIGATION IR SOLN
Status: DC | PRN
  Administered 2017-10-07: 1000 mL

## 2017-10-07 MED ORDER — MIDAZOLAM HCL 2 MG/2ML IJ SOLN
INTRAMUSCULAR | Status: AC
  Filled 2017-10-07: qty 2

## 2017-10-07 MED ORDER — SODIUM CHLORIDE 0.9 % IV SOLN
INTRAVENOUS | Status: DC
  Administered 2017-10-07 – 2017-10-08 (×2): via INTRAVENOUS

## 2017-10-07 MED ORDER — OXYCODONE HCL 5 MG PO TABS
10.0000 mg | ORAL_TABLET | ORAL | Status: DC | PRN
Start: 2017-10-07 — End: 2017-10-08
  Administered 2017-10-07 (×2): 10 mg via ORAL
  Filled 2017-10-07 (×2): qty 2

## 2017-10-07 MED ORDER — PROPOFOL 10 MG/ML IV BOLUS
INTRAVENOUS | Status: AC
  Filled 2017-10-07: qty 40

## 2017-10-07 MED ORDER — ASPIRIN EC 325 MG PO TBEC
325.0000 mg | DELAYED_RELEASE_TABLET | Freq: Every day | ORAL | Status: DC
  Administered 2017-10-08: 08:00:00 325 mg via ORAL
  Filled 2017-10-07: qty 1

## 2017-10-07 MED ORDER — DIPHENHYDRAMINE HCL 12.5 MG/5ML PO ELIX: 12.5000 mg | ORAL_SOLUTION | ORAL | Status: DC | PRN

## 2017-10-07 MED ORDER — FENTANYL CITRATE (PF) 100 MCG/2ML IJ SOLN
INTRAMUSCULAR | Status: AC
  Filled 2017-10-07: qty 2

## 2017-10-07 MED ORDER — POLYETHYLENE GLYCOL 3350 17 G PO PACK: 17.0000 g | PACK | Freq: Every day | ORAL | Status: DC | PRN

## 2017-10-07 MED ORDER — HYDROMORPHONE HCL 1 MG/ML IJ SOLN
1.0000 mg | INTRAMUSCULAR | Status: DC | PRN
  Administered 2017-10-07: 12:00:00 1 mg via INTRAVENOUS
  Filled 2017-10-07: qty 1

## 2017-10-07 SURGICAL SUPPLY — 40 items
BAG ZIPLOCK 12X15 (MISCELLANEOUS) IMPLANT
BENZOIN TINCTURE PRP APPL 2/3 (GAUZE/BANDAGES/DRESSINGS) IMPLANT
BLADE SAW SGTL 18X1.27X75 (BLADE) ×2 IMPLANT
CAPT HIP TOTAL 2 ×2 IMPLANT
CELLS DAT CNTRL 66122 CELL SVR (MISCELLANEOUS) ×1 IMPLANT
COVER PERINEAL POST (MISCELLANEOUS) ×2 IMPLANT
COVER SURGICAL LIGHT HANDLE (MISCELLANEOUS) ×2 IMPLANT
DRAPE STERI IOBAN 125X83 (DRAPES) ×2 IMPLANT
DRAPE U-SHAPE 47X51 STRL (DRAPES) ×4 IMPLANT
DRESSING AQUACEL AG SP 3.5X10 (GAUZE/BANDAGES/DRESSINGS) IMPLANT
DRSG AQUACEL AG ADV 3.5X10 (GAUZE/BANDAGES/DRESSINGS) ×2 IMPLANT
DRSG AQUACEL AG SP 3.5X10 (GAUZE/BANDAGES/DRESSINGS)
DURAPREP 26ML APPLICATOR (WOUND CARE) ×2 IMPLANT
ELECT REM PT RETURN 15FT ADLT (MISCELLANEOUS) ×2 IMPLANT
GAUZE XEROFORM 1X8 LF (GAUZE/BANDAGES/DRESSINGS) ×2 IMPLANT
GLOVE BIO SURGEON STRL SZ7.5 (GLOVE) ×4 IMPLANT
GLOVE BIOGEL PI IND STRL 7.0 (GLOVE) ×2 IMPLANT
GLOVE BIOGEL PI IND STRL 8 (GLOVE) ×2 IMPLANT
GLOVE BIOGEL PI INDICATOR 7.0 (GLOVE) ×2
GLOVE BIOGEL PI INDICATOR 8 (GLOVE) ×2
GLOVE ECLIPSE 8.0 STRL XLNG CF (GLOVE) ×2 IMPLANT
GOWN STRL REUS W/TWL LRG LVL3 (GOWN DISPOSABLE) ×2 IMPLANT
GOWN STRL REUS W/TWL XL LVL3 (GOWN DISPOSABLE) ×4 IMPLANT
HANDPIECE INTERPULSE COAX TIP (DISPOSABLE) ×1
HOLDER FOLEY CATH W/STRAP (MISCELLANEOUS) ×2 IMPLANT
PACK ANTERIOR HIP CUSTOM (KITS) ×2 IMPLANT
RTRCTR WOUND ALEXIS 18CM MED (MISCELLANEOUS) ×2
SET HNDPC FAN SPRY TIP SCT (DISPOSABLE) ×1 IMPLANT
STAPLER VISISTAT 35W (STAPLE) ×2 IMPLANT
STRIP CLOSURE SKIN 1/2X4 (GAUZE/BANDAGES/DRESSINGS) IMPLANT
SUT ETHIBOND NAB CT1 #1 30IN (SUTURE) ×2 IMPLANT
SUT ETHILON 2 0 PS N (SUTURE) ×2 IMPLANT
SUT MNCRL AB 4-0 PS2 18 (SUTURE) IMPLANT
SUT VIC AB 0 CT1 36 (SUTURE) ×2 IMPLANT
SUT VIC AB 1 CT1 36 (SUTURE) ×2 IMPLANT
SUT VIC AB 2-0 CT1 27 (SUTURE) ×2
SUT VIC AB 2-0 CT1 TAPERPNT 27 (SUTURE) ×2 IMPLANT
TRAY FOLEY CATH 14FRSI W/METER (CATHETERS) ×2 IMPLANT
TRAY FOLEY W/METER SILVER 16FR (SET/KITS/TRAYS/PACK) IMPLANT
YANKAUER SUCT BULB TIP 10FT TU (MISCELLANEOUS) ×2 IMPLANT

## 2017-10-07 NOTE — Anesthesia Postprocedure Evaluation (Signed)
Anesthesia Post Note  Patient: Kelsey Payne  Procedure(s) Performed: RIGHT TOTAL HIP ARTHROPLASTY ANTERIOR APPROACH (Right Hip)     Patient location during evaluation: PACU Anesthesia Type: MAC Level of consciousness: awake and alert Pain management: pain level controlled Vital Signs Assessment: post-procedure vital signs reviewed and stable Respiratory status: spontaneous breathing and respiratory function stable Cardiovascular status: blood pressure returned to baseline and stable Postop Assessment: spinal receding Anesthetic complications: no    Last Vitals:  Vitals:   10/07/17 1129 10/07/17 1150  BP: (!) 118/17 124/64  Pulse: 66 68  Resp: 14 16  Temp: 36.5 C 36.8 C  SpO2: 100% 99%    Last Pain:  Vitals:   10/07/17 1150  PainSc: 0-No pain                 Kaitlin Alcindor DANIEL

## 2017-10-07 NOTE — Evaluation (Signed)
Physical Therapy Evaluation Patient Details Name: Kelsey Payne MRN: [id number] DOB: 10-Dec-1960 Today's Date: 10/07/2017   History of Present Illness  Pt s/p R THR  Clinical Impression  Pt s/p R THR and presents with decreased R LE strength/ROM and post op pain limiting functional mobility.  Pt should progress to dc home with family assist.    Follow Up Recommendations Home health PT    Equipment Recommendations       Recommendations for Other Services OT consult     Precautions / Restrictions Precautions Precautions: Fall Restrictions Weight Bearing Restrictions: No      Mobility  Bed Mobility Overal bed mobility: Needs Assistance Bed Mobility: Supine to Sit     Supine to sit: Min assist     General bed mobility comments: cues for sequence and use of L LE to self assist  Transfers Overall transfer level: Needs assistance Equipment used: Rolling walker (2 wheeled) Transfers: Sit to/from Stand Sit to Stand: Min assist         General transfer comment: cues for LE management and use of UEs to self assist  Ambulation/Gait Ambulation/Gait assistance: Min assist Ambulation Distance (Feet): 60 Feet Assistive device: Rolling walker (2 wheeled) Gait Pattern/deviations: Step-to pattern;Decreased step length - right;Decreased step length - left;Shuffle;Trunk flexed Gait velocity: decr Gait velocity interpretation: Below normal speed for age/gender General Gait Details: cues for sequence, posture and position from [employer]            Wheelchair Mobility    Modified Rankin (Stroke Patients Only)       Balance Overall balance assessment: No apparent balance deficits (not formally assessed)                                           Pertinent Vitals/Pain Pain Assessment: 0-10 Pain Score: 4  Pain Location: R hip/thigh Pain Descriptors / Indicators: Aching;Sore Pain Intervention(s): Limited activity within patient's  tolerance;Monitored during session;Premedicated before session;Ice applied    Home Living Family/patient expects to be discharged to:: Private residence Living Arrangements: Spouse/significant other Available Help at Discharge: Family Type of Home: House Home Access: Stairs to enter Entrance Stairs-Rails: Psychiatric nurse of Steps: 4 + 1 Home Layout: One level Home Equipment: Environmental consultant - 2 wheels;Cane - single point;Bedside commode      Prior Function Level of Independence: Independent         Comments: Used cane ~1 wk     Hand Dominance        Extremity/Trunk Assessment   Upper Extremity Assessment Upper Extremity Assessment: Overall WFL for tasks assessed    Lower Extremity Assessment Lower Extremity Assessment: RLE deficits/detail       Communication   Communication: No difficulties  Cognition Arousal/Alertness: Awake/alert Behavior During Therapy: WFL for tasks assessed/performed Overall Cognitive Status: Within Functional Limits for tasks assessed                                        General Comments      Exercises Total Joint Exercises Ankle Circles/Pumps: AROM;Both;15 reps;Supine   Assessment/Plan    PT Assessment Patient needs continued PT services  PT Problem List Decreased strength;Decreased range of motion;Decreased activity tolerance;Decreased mobility;Decreased knowledge of use of DME;Pain       PT Treatment Interventions DME instruction;Gait  training;Stair training;Functional mobility training;Therapeutic activities;Therapeutic exercise;Patient/family education    PT Goals (Current goals can be found in the Care Plan section)  Acute Rehab PT Goals Patient Stated Goal: Regain IND and get home ASAP PT Goal Formulation: With patient Time For Goal Achievement: 10/11/17 Potential to Achieve Goals: Good    Frequency 7X/week   Barriers to discharge        Co-evaluation               AM-PAC PT "6  Clicks" Daily Activity  Outcome Measure                  End of Session Equipment Utilized During Treatment: Gait belt Activity Tolerance: Patient tolerated treatment well Patient left: in chair;with call bell/phone within reach;with family/visitor present Nurse Communication: Mobility status PT Visit Diagnosis: Difficulty in walking, not elsewhere classified (R26.2)    Time: 6222-9798 PT Time Calculation (min) (ACUTE ONLY): 29 min   Charges:   PT Evaluation $PT Eval Low Complexity: 1 Low PT Treatments $Gait Training: 8-22 mins   PT G Codes:        Pg [phone]   Lorenzo Arscott 10/07/2017, 4:09 PM

## 2017-10-07 NOTE — Transfer of Care (Signed)
Immediate Anesthesia Transfer of Care Note  Patient: Kelsey Payne  Procedure(s) Performed: RIGHT TOTAL HIP ARTHROPLASTY ANTERIOR APPROACH (Right Hip)  Patient Location: PACU  Anesthesia Type:MAC and Spinal  Level of Consciousness: awake, alert , oriented and patient cooperative  Airway & Oxygen Therapy: Patient Spontanous Breathing and Patient connected to face mask oxygen  Post-op Assessment: Report given to RN and Post -op Vital signs reviewed and stable  Post vital signs: Reviewed and stable  Last Vitals:  Vitals:   10/07/17 0645  BP: 137/77  Pulse: 69  Resp: 16  SpO2: 100%    Last Pain:  Vitals:   10/07/17 0645  PainSc: 3          Complications: No apparent anesthesia complications

## 2017-10-07 NOTE — H&P (Signed)
TOTAL HIP ADMISSION H&P  Patient is admitted for right total hip arthroplasty.  Subjective:  Chief Complaint: right hip pain  HPI: Kelsey Payne, 56 y.o. female, has a history of pain and functional disability in the right hip(s) due to arthritis and patient has failed non-surgical conservative treatments for greater than 12 weeks to include NSAID's and/or analgesics, corticosteriod injections, flexibility and strengthening excercises, weight reduction as appropriate and activity modification.  Onset of symptoms was gradual starting 2 years ago with gradually worsening course since that time.The patient noted no past surgery on the right hip(s).  Patient currently rates pain in the right hip at 10 out of 10 with activity. Patient has night pain, worsening of pain with activity and weight bearing, pain that interfers with activities of daily living and pain with passive range of motion. Patient has evidence of subchondral cysts, subchondral sclerosis, periarticular osteophytes and joint space narrowing by imaging studies. This condition presents safety issues increasing the risk of falls.  There is no current active infection.  Patient Active Problem List   Diagnosis Date Noted  . Unilateral primary osteoarthritis, right hip 10/07/2017  . Acute cholecystitis 06/12/2013  . HTN (hypertension) 06/12/2013  . Bilateral ovarian cysts 06/12/2013   Past Medical History:  Diagnosis Date  . Anxiety   . Arthritis   . Cancer (HCC)    basal cell nose  . Depression   . Hypertension     Past Surgical History:  Procedure Laterality Date  . ABDOMINAL HYSTERECTOMY    . CESAREAN SECTION    . CHOLECYSTECTOMY  06/12/2013   Procedure: LAPAROSCOPIC CHOLECYSTECTOMY;  Surgeon: Zenovia Jarred, MD;  Location: [place];  Service: General;;  . TOOTH EXTRACTION    . TUBAL LIGATION      Current Facility-Administered Medications  Medication Dose Route Frequency Provider Last Rate Last Dose  . ceFAZolin (ANCEF)  2-4 GM/100ML-% IVPB           . ceFAZolin (ANCEF) IVPB 2g/100 mL premix  2 g Intravenous On Call to OR Pete Pelt, PA-C      . chlorhexidine (HIBICLENS) 4 % liquid 4 application  60 mL Topical Once Pete Pelt, PA-C      . lactated ringers infusion   Intravenous Continuous Duane Boston, MD 75 mL/hr at 10/07/17 0650    . tranexamic acid (CYKLOKAPRON) 1,000 mg in sodium chloride 0.9 % 100 mL IVPB  1,000 mg Intravenous To OR Pete Pelt, PA-C       Allergies  Allergen Reactions  . Shellfish Allergy Anaphylaxis    Social History   Tobacco Use  . Smoking status: Former Smoker    Years: 36.00    Types: Cigarettes    Last attempt to quit: 05/29/2015    Years since quitting: 2.3  . Smokeless tobacco: Never Used  Substance Use Topics  . Alcohol use: Yes    Alcohol/week: 0.6 oz    Types: 1 Glasses of wine per week    Comment: daily    Family History  Adopted: Yes     Review of Systems  Musculoskeletal: Positive for joint pain.  All other systems reviewed and are negative.   Objective:  Physical Exam  Constitutional: She is oriented to person, place, and time. She appears well-developed and well-nourished.  HENT:  Head: Normocephalic and atraumatic.  Eyes: EOM are normal. Pupils are equal, round, and reactive to light.  Neck: Normal range of motion. Neck supple.  Cardiovascular: Normal rate and regular rhythm.  Respiratory: Effort normal and breath sounds normal.  GI: Soft. Bowel sounds are normal.  Musculoskeletal:       Right hip: She exhibits decreased range of motion, decreased strength, tenderness and bony tenderness.  Neurological: She is alert and oriented to person, place, and time.  Skin: Skin is warm and dry.  Psychiatric: She has a normal mood and affect.    Vital signs in last 24 hours: Pulse Rate:  [69] 69 (11/23 0645) Resp:  [16] 16 (11/23 0645) BP: (137)/(77) 137/77 (11/23 0645) SpO2:  [100 %] 100 % (11/23 0645) Weight:  [218 lb (98.9 kg)]  218 lb (98.9 kg) (11/23 0645)  Labs:   Estimated body mass index is 34.14 kg/m as calculated from the following:   Height as of this encounter: 5\' 7"  (1.702 m).   Weight as of this encounter: 218 lb (98.9 kg).   Imaging Review Plain radiographs demonstrate severe degenerative joint disease of the right hip(s). The bone quality appears to be excellent for age and reported activity level.  Assessment/Plan:  End stage arthritis, right hip(s)  The patient history, physical examination, clinical judgement of the provider and imaging studies are consistent with end stage degenerative joint disease of the right hip(s) and total hip arthroplasty is deemed medically necessary. The treatment options including medical management, injection therapy, arthroscopy and arthroplasty were discussed at length. The risks and benefits of total hip arthroplasty were presented and reviewed. The risks due to aseptic loosening, infection, stiffness, dislocation/subluxation,  thromboembolic complications and other imponderables were discussed.  The patient acknowledged the explanation, agreed to proceed with the plan and consent was signed. Patient is being admitted for inpatient treatment for surgery, pain control, PT, OT, prophylactic antibiotics, VTE prophylaxis, progressive ambulation and ADL's and discharge planning.The patient is planning to be discharged home with home health services

## 2017-10-07 NOTE — Op Note (Signed)
Kelsey, Payne NO.:  [id number]  MEDICAL RECORD NO.:  [id number]  LOCATION:                               FACILITY:  [facility]  PHYSICIAN:  Lind Guest. Ninfa Linden, M.D.DATE OF BIRTH:  1961-01-09  DATE OF PROCEDURE:  10/07/2017 DATE OF DISCHARGE:                              OPERATIVE REPORT   PREOPERATIVE DIAGNOSIS:  Primary osteoarthritis and degenerative joint disease, right hip.  POSTOPERATIVE DIAGNOSIS:  Primary osteoarthritis and degenerative joint disease, right hip.  PROCEDURE:  Right total hip arthroplasty through direct anterior approach.  IMPLANTS:  DePuy Sector Gription acetabular component size 52, size 36 +0 polyethylene liner, size 12 Corail femoral component with standard offset, size 36 +5 ceramic hip ball.  SURGEON:  Lind Guest. Ninfa Linden MD.  ASSISTANT:  Erskine Emery, PA-C.  ANESTHESIA:  Spinal.  ANTIBIOTICS:  IV Ancef 2 g.  BLOOD LOSS:  400 mL.  COMPLICATIONS:  None.  INDICATIONS:  Ms. Kelsey Payne is a 56 year old patient well known to me. She has been having worsening right hip pain for some time now, well documented; severe osteoarthritis; and degenerative disease of her right hip and you could see it on x-ray significantly.  Her pain is daily and has detrimentally affected her activities of daily living, her quality of life, and her mobility.  At this point, she does wish to proceed with a total hip arthroplasty through direct anterior approach.  She understands the rationale behind this and she understands the risk of acute blood loss anemia, nerve and vessel injury, fracture, infection, dislocation, DVT.  She understands our goals are to decrease pain, improve mobility, and overall improved quality of life.  PROCEDURE DESCRIPTION:  After informed consent was obtained, appropriate right hip was marked.  She was brought to the operating room, where spinal anesthesia was obtained while she was on her stretcher.  A  Foley catheter was placed and next, she was placed supine on the Hana fracture table with the perineal post in place and both legs in InLine skeletal traction boots, but no traction applied.  Her right operative hip was prepped and draped with DuraPrep and sterile drapes.  A time-out was called.  She was identified as correct patient and correct right hip. We then made an incision just inferior and posterior to the anterior superior iliac spine and carried this obliquely down the leg.  We dissected down the tensor fascia lata muscle, and the tensor fascia was then divided longitudinally to proceed with a direct anterior approach to the hip.  We identified and cauterized circumflex vessels and then identified the hip capsule.  I opened up the hip capsule in an L-type format, finding a large joint effusion, significant arthritis throughout her hip.  We placed Cobra retractors on the medial and lateral femoral neck and then made our femoral neck cut proximal to the lesser trochanter using oscillating saw and completed this on osteotome.  I placed a corkscrew guide in the femoral head and removed the femoral head in its entirety and found it to be devoid of cartilage.  We then placed a bent Hohmann over the medial acetabular rim and removed remnants of acetabular labrum and other debris.  We then  began reaming from a size 42 reamer in 2 mm increments up to a size 52 with all reamers under direct visualization, the last reamer under direct fluoroscopy, so we could obtain our depth of reaming, our inclination, and anteversion.  Once we were pleased with this, I placed the real DePuy Sector Gription acetabular component under direct visualization and fluoroscopy and then a 36 +0 polyethylene liner for a size 52 acetabular component.  Attention was then turned to the femur.  With the leg externally rotated to 120 degrees and adducted, we were able to place a Mueller retractor medially and a  Hohmann retractor behind the greater trochanter.  We released the lateral joint capsule and used a box cutting osteotome in the inner femoral canal and a rongeur to lateralize.  We then began broaching from a size 8 broach using Corail broaching system up to a size 12.  With a size 12 in place, we trialed a standard offset femoral neck and a 36 +1.5 hip ball and brought the leg back over and up with traction and internal rotation reducing the pelvis and I felt like she needed just a little bit more leg length based on exam.  Intraoperative findings showed that preoperatively she is longer on the right, but she is really not like that clinically.  I felt that she was just a touch shorter clinically.  We dislocated the hip and removed the trial components.  We then placed the real DePuy Corail femoral component size 12 with standard offset, the real 36 +5 hip ball. I felt this was appropriate as well due to far medialization of the acetabular component.  We reduced the hip then and I felt good about leg length, offset and range of motion and stability.  We then irrigated the hip with normal saline solution using pulsatile lavage.  We closed the joint capsule with interrupted #1 Ethibond suture, followed by running #1 Vicryl in the tensor fascia, 0 Vicryl in the deep tissue, 2-0 Vicryl in the subcutaneous tissue, interrupted staples, and nylon on the skin. Xeroform and Aquacel dressing were applied.  She was taken off the table, taken to the recovery room in stable condition.  All final counts were correct.  There were no complications noted.     Lind Guest. Ninfa Linden, M.D.   ______________________________ Lind Guest. Ninfa Linden, M.D.    CYB/MEDQ  D:  10/07/2017  T:  10/07/2017  Job:  [id number]

## 2017-10-07 NOTE — Anesthesia Procedure Notes (Signed)
Spinal  Patient location during procedure: OR Start time: 10/07/2017 8:47 AM End time: 10/07/2017 8:57 AM Staffing Anesthesiologist: Duane Boston, MD Performed: anesthesiologist  Preanesthetic Checklist Completed: patient identified, surgical consent, pre-op evaluation, timeout performed, IV checked, risks and benefits discussed and monitors and equipment checked Spinal Block Patient position: sitting Prep: DuraPrep Patient monitoring: cardiac monitor, continuous pulse ox and blood pressure Approach: midline Location: L2-3 Injection technique: single-shot Needle Needle type: Quincke  Needle gauge: 22 G Needle length: 9 cm Additional Notes Functioning IV was confirmed and monitors were applied. Sterile prep and drape, including hand hygiene and sterile gloves were used. The patient was positioned and the spine was prepped. The skin was anesthetized with lidocaine.  Free flow of clear CSF was obtained prior to injecting local anesthetic into the CSF.  The spinal needle aspirated freely following injection.  The needle was carefully withdrawn.  The patient tolerated the procedure well. Difficult spinal.

## 2017-10-07 NOTE — Op Note (Deleted)
  The note originally documented on this encounter has been moved the the encounter in which it belongs.  

## 2017-10-07 NOTE — Brief Op Note (Signed)
10/07/2017  10:18 AM  PATIENT:  Kelsey Payne  56 y.o. female  PRE-OPERATIVE DIAGNOSIS:  osteoarthritis right hip  POST-OPERATIVE DIAGNOSIS:  osteoarthritis right hip  PROCEDURE:  Procedure(s): RIGHT TOTAL HIP ARTHROPLASTY ANTERIOR APPROACH (Right)  SURGEON:  Surgeon(s) and Role:    Mcarthur Rossetti, MD - Primary  PHYSICIAN ASSISTANT: Benita Stabile, PA-C  ANESTHESIA:   spinal  EBL:  400 mL   COUNTS:  YES  DICTATION: .Other Dictation: Dictation Number [id number]  PLAN OF CARE: Admit to inpatient   PATIENT DISPOSITION:  PACU - hemodynamically stable.   Delay start of Pharmacological VTE agent (>24hrs) due to surgical blood loss or risk of bleeding: no

## 2017-10-08 LAB — BASIC METABOLIC PANEL
Anion gap: 8 (ref 5–15)
BUN: 11 mg/dL (ref 6–20)
CHLORIDE: 101 mmol/L (ref 101–111)
CO2: 28 mmol/L (ref 22–32)
CREATININE: 0.73 mg/dL (ref 0.44–1.00)
Calcium: 8.7 mg/dL — ABNORMAL LOW (ref 8.9–10.3)
GFR calc Af Amer: >60 mL/min (ref >60)
GFR calc non Af Amer: >60 mL/min (ref >60)
GLUCOSE: 126 mg/dL — AB (ref 65–99)
Potassium: 3.9 mmol/L (ref 3.5–5.1)
SODIUM: 137 mmol/L (ref 135–145)

## 2017-10-08 LAB — CBC
HCT: 36 % (ref 36.0–46.0)
HEMOGLOBIN: 11.8 g/dL — AB (ref 12.0–15.0)
MCH: 29.4 pg (ref 26.0–34.0)
MCHC: 32.8 g/dL (ref 30.0–36.0)
MCV: 89.8 fL (ref 78.0–100.0)
Platelets: 283 10*3/uL (ref 150–400)
RBC: 4.01 MIL/uL (ref 3.87–5.11)
RDW: 13.7 % (ref 11.5–15.5)
WBC: 14 10*3/uL — ABNORMAL HIGH (ref 4.0–10.5)

## 2017-10-08 MED ORDER — ASPIRIN 325 MG PO TBEC: 325.0000 mg | DELAYED_RELEASE_TABLET | Freq: Every day | ORAL | 0 refills | Status: DC

## 2017-10-08 MED ORDER — OXYCODONE-ACETAMINOPHEN 5-325 MG PO TABS: 1.0000 | ORAL_TABLET | ORAL | 0 refills | Status: DC | PRN

## 2017-10-08 MED ORDER — METHOCARBAMOL 500 MG PO TABS: 500.0000 mg | ORAL_TABLET | Freq: Four times a day (QID) | ORAL | 0 refills | Status: DC | PRN

## 2017-10-08 NOTE — Discharge Instructions (Signed)

## 2017-10-08 NOTE — Evaluation (Signed)
Occupational Therapy Evaluation Patient Details Name: Caran Storck MRN: [id number] DOB: [birth year] Today's Date: 10/08/2017    History of Present Illness Pt s/p R THR   Clinical Impression   This 56 year old female was admitted for the above sx. All education was completed. No further OT is needed at this time    Follow Up Recommendations  Supervision/Assistance - 24 hour    Equipment Recommendations  None recommended by OT    Recommendations for Other Services       Precautions / Restrictions Precautions Precautions: Fall Restrictions Weight Bearing Restrictions: No      Mobility Bed Mobility               General bed mobility comments: OOB  Transfers Overall transfer level: Needs assistance Equipment used: Rolling walker (2 wheeled) Transfers: Sit to/from Stand Sit to Stand: Min assist         General transfer comment: for safety    Balance Overall balance assessment: No apparent balance deficits (not formally assessed)                                         ADL either performed or assessed with clinical judgement   ADL Overall ADL's : Needs assistance/impaired Eating/Feeding: Independent   Grooming: Supervision/safety;Standing   Upper Body Bathing: Set up;Sitting   Lower Body Bathing: Minimal assistance;Sit to/from stand   Upper Body Dressing : Set up;Sitting   Lower Body Dressing: Minimal assistance;Sit to/from stand;With adaptive equipment   Toilet Transfer: Min guard;BSC;RW;Ambulation   Toileting- Water quality scientist and Hygiene: Min guard;Sit to/from stand         General ADL Comments: pt got dressed and used AE.  She has a sock aide (loaned) and Secondary school teacher.  Pt had yoga type of pants so min A needed to work material up leg.  Educated on tub readiness. She will sponge bathe initially     Vision         Perception     Praxis      Pertinent Vitals/Pain Pain Assessment: 0-10 Pain Score: 4  Pain  Location: R hip/thigh Pain Descriptors / Indicators: Aching;Sore Pain Intervention(s): Limited activity within patient's tolerance;Monitored during session;Premedicated before session;Repositioned     Hand Dominance     Extremity/Trunk Assessment Upper Extremity Assessment Upper Extremity Assessment: Overall WFL for tasks assessed           Communication Communication Communication: No difficulties   Cognition Arousal/Alertness: Awake/alert Behavior During Therapy: WFL for tasks assessed/performed Overall Cognitive Status: Within Functional Limits for tasks assessed                                     General Comments       Exercises     Shoulder Instructions      Home Living Family/patient expects to be discharged to:: Private residence Living Arrangements: Spouse/significant other Available Help at Discharge: Family               Bathroom Shower/Tub: Teacher, early years/pre: Standard         Additional Comments: borrowing 3:1      Prior Functioning/Environment Level of Independence: Independent        Comments: Used cane ~1 wk        OT Problem List:  OT Treatment/Interventions:      OT Goals(Current goals can be found in the care plan section) Acute Rehab OT Goals Patient Stated Goal: Regain IND and get home ASAP OT Goal Formulation: All assessment and education complete, DC therapy  OT Frequency:     Barriers to D/C:            Co-evaluation              AM-PAC PT "6 Clicks" Daily Activity     Outcome Measure Help from another person eating meals?: None Help from another person taking care of personal grooming?: A Little Help from another person toileting, which includes using toliet, bedpan, or urinal?: A Little Help from another person bathing (including washing, rinsing, drying)?: A Little Help from another person to put on and taking off regular upper body clothing?: A Little Help from  another person to put on and taking off regular lower body clothing?: A Little 6 Click Score: 19   End of Session    Activity Tolerance: Patient tolerated treatment well Patient left: in chair;with call bell/phone within reach;with family/visitor present  OT Visit Diagnosis: Pain Pain - Right/Left: Right Pain - part of body: Hip                Time: 0802-2336 OT Time Calculation (min): 18 min Charges:  OT General Charges $OT Visit: 1 Visit OT Evaluation $OT Eval Low Complexity: 1 Low G-Codes:     Lesle Chris, OTR/L [phone] 10/08/2017  Demontae Antunes 10/08/2017, 3:40 PM

## 2017-10-08 NOTE — Care Management Note (Signed)
Case Management Note  Patient Details  Name: Kelsey Payne MRN: [id number] Date of Birth: 1961-06-09  Subjective/Objective:   S/p R THR                 Action/Plan: Discharge Planning: NCM spoke to pt at bedside. Pt has RW at home. States will borrow bedside commode. Offered choice for [facility] (preoperatively arrange with [facility]) Pt agreeable to [facility] for HHPT.   PCP Donald Prose MD  Expected Discharge Date:                Expected Discharge Plan:  [place]  In-House Referral:  NA  Discharge planning Services  CM Consult  Post Acute Care Choice:  Home Health Choice offered to:  Patient  DME Arranged:  Walker rolling DME Agency:  [place]:  PT [place] Agency:  [facility] ([facility])  Status of Service:  Completed, signed off  If discussed at [employer] of Stay Meetings, dates discussed:    Additional Comments:  Erenest Rasher, RN 10/08/2017, 1:00 PM

## 2017-10-08 NOTE — Progress Notes (Signed)
Discharged from floor via w/c for transport to home by car. Spouse & belongings with pt. No changes in assessment. Jaimya Feliciano, [employer]

## 2017-10-08 NOTE — Discharge Summary (Signed)
Patient ID: Kelsey Payne MRN: [id number] DOB/AGE: [birth year] 56 y.o.  Admit date: 10/07/2017 Discharge date: 10/08/2017  Admission Diagnoses:  Principal Problem:   Unilateral primary osteoarthritis, right hip Active Problems:   Status post total replacement of right hip   Discharge Diagnoses:  Same  Past Medical History:  Diagnosis Date  . Anxiety   . Arthritis   . Cancer (HCC)    basal cell nose  . Depression   . Hypertension     Surgeries: Procedure(s): RIGHT TOTAL HIP ARTHROPLASTY ANTERIOR APPROACH on 10/07/2017   Consultants:   Discharged Condition: Improved  Hospital Course: Marna Weniger is an 56 y.o. female who was admitted 10/07/2017 for operative treatment ofUnilateral primary osteoarthritis, right hip. Patient has severe unremitting pain that affects sleep, daily activities, and work/hobbies. After pre-op clearance the patient was taken to the operating room on 10/07/2017 and underwent  Procedure(s): RIGHT TOTAL HIP ARTHROPLASTY ANTERIOR APPROACH.    Patient was given perioperative antibiotics:  Anti-infectives (From admission, onward)   Start     Dose/Rate Route Frequency Ordered Stop   10/07/17 1400  ceFAZolin (ANCEF) IVPB 1 g/50 mL premix     1 g 100 mL/hr over 30 Minutes Intravenous Every 6 hours 10/07/17 1141 10/07/17 2015   10/07/17 0708  ceFAZolin (ANCEF) 2-4 GM/100ML-% IVPB    Comments:  Sandrea Matte   : cabinet override      10/07/17 0708 10/07/17 0855   10/07/17 0644  ceFAZolin (ANCEF) IVPB 2g/100 mL premix     2 g 200 mL/hr over 30 Minutes Intravenous On call to O.R. 10/07/17 6599 10/07/17 0925       Patient was given sequential compression devices, early ambulation, and chemoprophylaxis to prevent DVT.  Patient benefited maximally from hospital stay and there were no complications.    Recent vital signs:  Patient Vitals for the past 24 hrs:  BP Temp Temp src Pulse Resp SpO2  10/08/17 1413 (!) 115/58 98 F (36.7 C) Oral 71 16 95  %  10/08/17 1047 128/68 98.1 F (36.7 C) Oral 79 18 100 %  10/08/17 0551 114/65 97.9 F (36.6 C) Oral 67 18 100 %  10/08/17 0114 129/74 98.1 F (36.7 C) Oral 68 15 99 %  10/07/17 2141 122/66 98.7 F (37.1 C) Oral 67 15 98 %  10/07/17 1805 137/63 98.1 F (36.7 C) Oral 71 16 100 %     Recent laboratory studies:  Recent Labs    10/08/17 0608  WBC 14.0*  HGB 11.8*  HCT 36.0  PLT 283  NA 137  K 3.9  CL 101  CO2 28  BUN 11  CREATININE 0.73  GLUCOSE 126*  CALCIUM 8.7*     Discharge Medications:   Allergies as of 10/08/2017      Reactions   Shellfish Allergy Anaphylaxis      Medication List    TAKE these medications   acetaminophen 500 MG tablet Commonly known as:  TYLENOL Take 1,000 mg by mouth every 6 (six) hours as needed.   ALEVE 220 MG tablet Generic drug:  naproxen sodium Take 660 mg daily by mouth. HIP PAIN.   amLODipine 10 MG tablet Commonly known as:  NORVASC Take 10 mg daily by mouth.   aspirin 325 MG EC tablet Take 1 tablet (325 mg total) by mouth daily with breakfast. Start taking on:  10/09/2017   buPROPion 300 MG 24 hr tablet Commonly known as:  WELLBUTRIN XL Take 300 mg daily by mouth.   lisinopril  40 MG tablet Commonly known as:  PRINIVIL,ZESTRIL Take 40 mg daily by mouth.   methocarbamol 500 MG tablet Commonly known as:  ROBAXIN Take 1 tablet (500 mg total) by mouth every 6 (six) hours as needed for muscle spasms.   oxyCODONE-acetaminophen 5-325 MG tablet Commonly known as:  ROXICET Take 1-2 tablets by mouth every 4 (four) hours as needed.   sertraline 50 MG tablet Commonly known as:  ZOLOFT Take 50 mg daily by mouth.            Durable Medical Equipment  (From admission, onward)        Start     Ordered   10/07/17 1142  DME 3 n 1  Once     10/07/17 1141   10/07/17 1142  DME Walker rolling  Once    Question:  Patient needs a walker to treat with the following condition  Answer:  Status post total replacement of right  hip   10/07/17 1141      Diagnostic Studies: Dg Pelvis Portable  Result Date: 10/07/2017 CLINICAL DATA:  Status post total replacement of right hip EXAM: PORTABLE PELVIS 1-2 VIEWS COMPARISON:  Plain film of the pelvis and right hip dated 09/07/2017. FINDINGS: Status post total right hip arthroplasty. Hardware appears appropriately positioned. Adjacent osseous structures are anatomic in alignment. Visualized soft tissues about the pelvis are unremarkable. IMPRESSION: Status post total right hip arthroplasty. Hardware appears intact and appropriately positioned. No evidence of surgical complicating feature. Electronically Signed   By: Franki Cabot M.D.   On: 10/07/2017 11:06   Dg C-arm 1-60 Min-no Report  Result Date: 10/07/2017 Fluoroscopy was utilized by the requesting physician.  No radiographic interpretation.   Dg Hip Operative Unilat W Or W/o Pelvis Right  Result Date: 10/07/2017 CLINICAL DATA:  Right hip replacement EXAM: OPERATIVE RIGHT HIP WITH PELVIS COMPARISON:  None. FLUOROSCOPY TIME:  Radiation Exposure Index (as provided by the fluoroscopic device): Not available If the device does not provide the exposure index: Fluoroscopy Time:  43 seconds Number of Acquired Images:  4 FINDINGS: Initial images demonstrate mild degenerative changes of the right hip joint. Right hip replacement is subsequently noted in satisfactory position. IMPRESSION: Status post right hip replacement Electronically Signed   By: Inez Catalina M.D.   On: 10/07/2017 10:37    Disposition: 01-Home or Self Care  Discharge Instructions    Discharge patient   Complete by:  As directed    Discharge disposition:  01-Home or Self Care   Discharge patient date:  10/08/2017      Follow-up Information    [facility] up.   Specialty:  [place] Why:  Home Health Physical Therapy -agency will call to arrange initial visit Contact information: [place]  [address] [phone]        Mcarthur Rossetti, MD Follow up in 2 week(s).   Specialty:  Orthopedic Surgery Contact information: [place] [address] [phone]            Signed: Mcarthur Rossetti 10/08/2017, 3:14 PM

## 2017-10-08 NOTE — Progress Notes (Signed)
Physical Therapy Treatment Patient Details Name: Kelsey Payne MRN: [id number] DOB: 09-Jan-1961 Today's Date: 10/08/2017    History of Present Illness Pt s/p R THR    PT Comments    Pt motivated this am and progressing with mobility but with reports of increased discomfort and stiffness.   Follow Up Recommendations  Home health PT     Equipment Recommendations  Rolling walker with 5" wheels    Recommendations for Other Services OT consult     Precautions / Restrictions Precautions Precautions: Fall Restrictions Weight Bearing Restrictions: No    Mobility  Bed Mobility Overal bed mobility: Needs Assistance Bed Mobility: Supine to Sit     Supine to sit: Min assist     General bed mobility comments: cues for sequence and use of L LE to self assist  Transfers Overall transfer level: Needs assistance Equipment used: Rolling walker (2 wheeled) Transfers: Sit to/from Stand Sit to Stand: Min assist         General transfer comment: cues for LE management and use of UEs to self assist  Ambulation/Gait Ambulation/Gait assistance: Min assist Ambulation Distance (Feet): 120 Feet Assistive device: Rolling walker (2 wheeled) Gait Pattern/deviations: Step-to pattern;Decreased step length - right;Decreased step length - left;Shuffle;Trunk flexed Gait velocity: decr Gait velocity interpretation: Below normal speed for age/gender General Gait Details: cues for sequence, posture and position from [employer]            Wheelchair Mobility    Modified Rankin (Stroke Patients Only)       Balance Overall balance assessment: No apparent balance deficits (not formally assessed)                                          Cognition Arousal/Alertness: Awake/alert Behavior During Therapy: WFL for tasks assessed/performed Overall Cognitive Status: Within Functional Limits for tasks assessed                                        Exercises Total Joint Exercises Ankle Circles/Pumps: AROM;Both;15 reps;Supine Quad Sets: AROM;Both;10 reps;Supine Heel Slides: AAROM;Right;20 reps;Supine Hip ABduction/ADduction: AAROM;Right;15 reps;Supine    General Comments        Pertinent Vitals/Pain Pain Assessment: 0-10 Pain Score: 5  Pain Location: R hip/thigh Pain Descriptors / Indicators: Aching;Sore Pain Intervention(s): Limited activity within patient's tolerance;Monitored during session;Premedicated before session;Ice applied;Patient requesting pain meds-RN notified    Home Living                      Prior Function            PT Goals (current goals can now be found in the care plan section) Acute Rehab PT Goals Patient Stated Goal: Regain IND and get home ASAP PT Goal Formulation: With patient Time For Goal Achievement: 10/11/17 Potential to Achieve Goals: Good Progress towards PT goals: Progressing toward goals    Frequency    7X/week      PT Plan Current plan remains appropriate    Co-evaluation              AM-PAC PT "6 Clicks" Daily Activity  Outcome Measure  Difficulty turning over in bed (including adjusting bedclothes, sheets and blankets)?: Unable Difficulty moving from lying on back to sitting on the side of the  bed? : Unable Difficulty sitting down on and standing up from a chair with arms (e.g., wheelchair, bedside commode, etc,.)?: Unable Help needed moving to and from a bed to chair (including a wheelchair)?: A Little Help needed walking in hospital room?: A Little Help needed climbing 3-5 steps with a railing? : A Little 6 Click Score: 12    End of Session Equipment Utilized During Treatment: Gait belt Activity Tolerance: Patient tolerated treatment well Patient left: in chair;with call bell/phone within reach;with family/visitor present Nurse Communication: Mobility status PT Visit Diagnosis: Difficulty in walking, not elsewhere classified (R26.2)     Time:  5366-4403 PT Time Calculation (min) (ACUTE ONLY): 42 min  Charges:  $Gait Training: 23-37 mins $Therapeutic Exercise: 8-22 mins                    G Codes:       Pg [phone]    Ricahrd Schwager 10/08/2017, 8:59 AM

## 2017-10-08 NOTE — Progress Notes (Signed)
Patient ID: Kelsey Payne, female   DOB: [birth year], 56 y.o.   MRN: [id number] Patient is postoperative day 1 right total hip arthroplasty she has done well with therapy this morning she states that she will do stairs this afternoon and if she is safe with stairs she could go home possibly this afternoon.

## 2017-10-08 NOTE — Progress Notes (Signed)
Physical Therapy Treatment Patient Details Name: Kelsey Payne MRN: [id number] DOB: [birth year] Today's Date: 10/08/2017    History of Present Illness Pt s/p R THR    PT Comments    Pt progressing well with mobility and eager to return home.  Reviewed therex, stairs and car transfers with pt and spouse   Follow Up Recommendations  Home health PT     Equipment Recommendations  Rolling walker with 5" wheels    Recommendations for Other Services OT consult     Precautions / Restrictions Precautions Precautions: Fall Restrictions Weight Bearing Restrictions: No    Mobility  Bed Mobility               General bed mobility comments: OOB and requests back to chair to wait for OT  Transfers Overall transfer level: Needs assistance Equipment used: Rolling walker (2 wheeled) Transfers: Sit to/from Stand Sit to Stand: Min guard;Supervision         General transfer comment: cues for LE management and use of UEs to self assist  Ambulation/Gait Ambulation/Gait assistance: Min guard;Supervision Ambulation Distance (Feet): 200 Feet Assistive device: Rolling walker (2 wheeled) Gait Pattern/deviations: Decreased step length - right;Decreased step length - left;Shuffle;Trunk flexed;Step-to pattern;Step-through pattern Gait velocity: decr Gait velocity interpretation: Below normal speed for age/gender General Gait Details: cues for sequence, posture and position from RW   Stairs Stairs: Yes   Stair Management: One rail Right;Step to pattern;Forwards;With cane Number of Stairs: 5 General stair comments: cues for sequence and foot/cane placement.  Spouse present to assist and written instruction provided  Wheelchair Mobility    Modified Rankin (Stroke Patients Only)       Balance Overall balance assessment: No apparent balance deficits (not formally assessed)                                          Cognition Arousal/Alertness:  Awake/alert Behavior During Therapy: WFL for tasks assessed/performed Overall Cognitive Status: Within Functional Limits for tasks assessed                                        Exercises      General Comments        Pertinent Vitals/Pain Pain Assessment: 0-10 Pain Score: 4  Pain Location: R hip/thigh Pain Descriptors / Indicators: Aching;Sore Pain Intervention(s): Limited activity within patient's tolerance;Monitored during session;Premedicated before session;Ice applied    Home Living                      Prior Function            PT Goals (current goals can now be found in the care plan section) Acute Rehab PT Goals Patient Stated Goal: Regain IND and get home ASAP PT Goal Formulation: With patient Time For Goal Achievement: 10/11/17 Potential to Achieve Goals: Good Progress towards PT goals: Progressing toward goals    Frequency    7X/week      PT Plan Current plan remains appropriate    Co-evaluation              AM-PAC PT "6 Clicks" Daily Activity  Outcome Measure  Difficulty turning over in bed (including adjusting bedclothes, sheets and blankets)?: Unable Difficulty moving from lying on back to sitting on the side of the  bed? : Unable Difficulty sitting down on and standing up from a chair with arms (e.g., wheelchair, bedside commode, etc,.)?: Unable Help needed moving to and from a bed to chair (including a wheelchair)?: A Little Help needed walking in hospital room?: A Little Help needed climbing 3-5 steps with a railing? : A Little 6 Click Score: 12    End of Session Equipment Utilized During Treatment: Gait belt Activity Tolerance: Patient tolerated treatment well Patient left: in chair;with call bell/phone within reach;with family/visitor present Nurse Communication: Mobility status PT Visit Diagnosis: Difficulty in walking, not elsewhere classified (R26.2)     Time: 5520-8022 PT Time Calculation (min)  (ACUTE ONLY): 24 min  Charges:  $Gait Training: 8-22 mins $Therapeutic Activity: 8-22 mins                    G Codes:       Pg [phone]    Renuka Farfan 10/08/2017, 1:31 PM

## 2017-10-13 ENCOUNTER — Telehealth (INDEPENDENT_AMBULATORY_CARE_PROVIDER_SITE_OTHER): Payer: Self-pay | Admitting: Orthopaedic Surgery

## 2017-10-20 ENCOUNTER — Ambulatory Visit (INDEPENDENT_AMBULATORY_CARE_PROVIDER_SITE_OTHER): Payer: BC Managed Care – PPO | Admitting: Orthopaedic Surgery

## 2017-10-20 ENCOUNTER — Encounter (INDEPENDENT_AMBULATORY_CARE_PROVIDER_SITE_OTHER): Payer: Self-pay | Admitting: Orthopaedic Surgery

## 2017-10-20 DIAGNOSIS — Z96641 Presence of right artificial hip joint: Secondary | ICD-10-CM

## 2017-10-20 NOTE — Progress Notes (Signed)
The patient is now 2 weeks tomorrow status post right total hip arthroplasty surgeon to place.  She is overall doing well.  She feels like this from time off of work and I agree with this.  On examination her ligaments are equal.  Her staple line looks good.  Remove the staples and then some sutures up in the groin crease.  The area did explain to little bit of dehiscence.  I need her to Place Bactroban ointment on this daily after each shower and place a Band-Aid on.  I placed Steri-Strips over the wrist incision looks good.  There is some swelling there but no significant seroma.  There is no evidence of infection otherwise.  All questions concerns were answered and addressed.  She is using a cane now and should continue to ensure she is comfortable doing without.  She is cleared to drive when she is comfortable doing so.  I will see her back in a month to see how she is doing overall or sooner if there is any issues.

## 2017-10-25 ENCOUNTER — Telehealth (INDEPENDENT_AMBULATORY_CARE_PROVIDER_SITE_OTHER): Payer: Self-pay | Admitting: Orthopaedic Surgery

## 2017-10-25 NOTE — Telephone Encounter (Signed)
Patient called requesting an RX refill on her pain medication.  CB#[id number].  Thank you.

## 2017-10-25 NOTE — Telephone Encounter (Signed)
Please advise 

## 2017-10-26 MED ORDER — OXYCODONE-ACETAMINOPHEN 5-325 MG PO TABS: 1.0000 | ORAL_TABLET | Freq: Four times a day (QID) | ORAL | 0 refills | Status: DC | PRN

## 2017-10-26 NOTE — Telephone Encounter (Signed)
Can come and pick up a script 

## 2017-10-26 NOTE — Telephone Encounter (Signed)
Rx ready for pick up at the front desk. Called patient. she is aware.

## 2017-11-21 ENCOUNTER — Ambulatory Visit (INDEPENDENT_AMBULATORY_CARE_PROVIDER_SITE_OTHER): Payer: BC Managed Care – PPO | Admitting: Orthopaedic Surgery

## 2017-11-21 ENCOUNTER — Encounter (INDEPENDENT_AMBULATORY_CARE_PROVIDER_SITE_OTHER): Payer: Self-pay | Admitting: Orthopaedic Surgery

## 2017-11-21 DIAGNOSIS — Z96641 Presence of right artificial hip joint: Secondary | ICD-10-CM

## 2017-11-21 NOTE — Progress Notes (Signed)
The patient is now 45 days status post a right total hip arthroplasty through direct anterior approach.  She says she is having some increased back pain with walking and she feels that the right leg is slightly longer than the left.  The right side was her operative side.  She has some soreness in her right thigh but overall feels like she is doing well she does takes Aleve as needed.  She says she is much better than she was preoperative.  On examination her leg completely supine.  She is slightly longer on the right but is absolutely minimal in terms of just a few millimeters however she may proceed its longer.  Hopefully this will somewhat settle out with time but I told her it may not.  I would like to try just an over-the-counter insert just to wear in the left shoe and see how that does.  All questions and concerns were answered and addressed.  I do not need to see her back for 6 months.  At that visit I would like a standing low AP pelvis and a lateral of her right operative hip.

## 2017-12-13 ENCOUNTER — Other Ambulatory Visit: Payer: Self-pay

## 2017-12-13 ENCOUNTER — Encounter: Payer: Self-pay | Admitting: Physician Assistant

## 2017-12-13 ENCOUNTER — Ambulatory Visit: Payer: BC Managed Care – PPO | Admitting: Physician Assistant

## 2017-12-13 VITALS — BP 118/80 | HR 77 | Temp 97.6°F | Resp 18 | Ht 67.0 in | Wt 222.0 lb

## 2017-12-13 DIAGNOSIS — Z6834 Body mass index (BMI) 34.0-34.9, adult: Secondary | ICD-10-CM | POA: Diagnosis not present

## 2017-12-13 DIAGNOSIS — Z114 Encounter for screening for human immunodeficiency virus [HIV]: Secondary | ICD-10-CM

## 2017-12-13 DIAGNOSIS — Z1211 Encounter for screening for malignant neoplasm of colon: Secondary | ICD-10-CM

## 2017-12-13 DIAGNOSIS — F329 Major depressive disorder, single episode, unspecified: Secondary | ICD-10-CM | POA: Diagnosis not present

## 2017-12-13 DIAGNOSIS — N83201 Unspecified ovarian cyst, right side: Secondary | ICD-10-CM

## 2017-12-13 DIAGNOSIS — Z1231 Encounter for screening mammogram for malignant neoplasm of breast: Secondary | ICD-10-CM

## 2017-12-13 DIAGNOSIS — Z1389 Encounter for screening for other disorder: Secondary | ICD-10-CM | POA: Diagnosis not present

## 2017-12-13 DIAGNOSIS — Z124 Encounter for screening for malignant neoplasm of cervix: Secondary | ICD-10-CM

## 2017-12-13 DIAGNOSIS — Z1159 Encounter for screening for other viral diseases: Secondary | ICD-10-CM | POA: Diagnosis not present

## 2017-12-13 DIAGNOSIS — Z130 Encounter for screening for diseases of the blood and blood-forming organs and certain disorders involving the immune mechanism: Secondary | ICD-10-CM

## 2017-12-13 DIAGNOSIS — I10 Essential (primary) hypertension: Secondary | ICD-10-CM

## 2017-12-13 DIAGNOSIS — Z131 Encounter for screening for diabetes mellitus: Secondary | ICD-10-CM

## 2017-12-13 DIAGNOSIS — Z23 Encounter for immunization: Secondary | ICD-10-CM

## 2017-12-13 DIAGNOSIS — Z13220 Encounter for screening for lipoid disorders: Secondary | ICD-10-CM

## 2017-12-13 DIAGNOSIS — Z1329 Encounter for screening for other suspected endocrine disorder: Secondary | ICD-10-CM | POA: Diagnosis not present

## 2017-12-13 DIAGNOSIS — N83202 Unspecified ovarian cyst, left side: Secondary | ICD-10-CM

## 2017-12-13 DIAGNOSIS — F32A Depression, unspecified: Secondary | ICD-10-CM

## 2017-12-13 NOTE — Assessment & Plan Note (Signed)
Incidental finding on evaluation of abdominal pain that turned out to be cholecystitis. Asymptomatic. No additional evaluation needed at this time.

## 2017-12-13 NOTE — Assessment & Plan Note (Signed)
Controlled. Continue amlodipine. Amlodipine may contribute to LE edema. Consider addition of or change to ACEI in the future.

## 2017-12-13 NOTE — Assessment & Plan Note (Signed)
Stable. Some life stressors presently, primarily financial. Monitor for worsening symptoms.

## 2017-12-13 NOTE — Patient Instructions (Signed)
     IF you received an x-ray today, you will receive an invoice from [facility]. Please contact  Radiology at [phone] with questions or concerns regarding your invoice.   IF you received labwork today, you will receive an invoice from [facility]. Please contact [facility] at [phone] with questions or concerns regarding your invoice.   Our billing staff will not be able to assist you with questions regarding bills from these companies.  You will be contacted with the lab results as soon as they are available. The fastest way to get your results is to activate your My Chart account. Instructions are located on the last page of this paperwork. If you have not heard from us regarding the results in 2 weeks, please contact this office.     

## 2017-12-13 NOTE — Assessment & Plan Note (Signed)
Continue healthier eating choices and increasing exercise.

## 2017-12-13 NOTE — Progress Notes (Signed)
Patient ID: Kelsey Payne, female     DOB: 06/17/1961, 57 y.o.    MRN: [id number]  PCP: Kelsey Prose, MD  Chief Complaint  Patient presents with  . Establish Care  . Depression    Depression scale score 8    Subjective:   This patient is new to me and presents for evaluation of depression, HTN. Needs to establish for primary care. Her colleague recommended me to her.  Eats a vegetarian, mostly vegan, diet. Gets regular exercise, new since RIGHT THR. Frequent diarrhea (uses Immodium once a week) since cholecystectomy. Gets plenty of fiber in her diet. Former smoker, quit 3 years ago after 39 years of 1+ ppd.  No history of abnormal pap test. Isn't sure if her cervix remains. Procedure was performed in [place], [place]. Hysterectomy due to fibroids.  Paying a lot of medical bills for herself and her husband (he has asthma), and supporting her adult son. Needs to put off some of the screening procedures recommended. She has a number of care gaps.  No specific problems or concerns today. Does not need prescription refills at this time.  Review of Systems  Constitutional: Negative for chills and fever.  Respiratory: Negative for cough and shortness of breath.   Cardiovascular: Positive for leg swelling. Negative for chest pain and palpitations.  Gastrointestinal: Positive for diarrhea. Negative for abdominal pain, nausea and vomiting.  Endocrine: Negative.  Negative for polydipsia.  Genitourinary: Negative for dysuria, frequency and urgency.  Musculoskeletal: Negative for myalgias.  Skin: Negative for rash.  Allergic/Immunologic: Negative.   Neurological: Negative for dizziness and headaches.  Hematological: Negative.   Psychiatric/Behavioral: Positive for dysphoric mood and sleep disturbance. Negative for agitation, behavioral problems, confusion, decreased concentration, hallucinations, self-injury and suicidal ideas. The patient is not nervous/anxious and is not  hyperactive.    Depression screen PHQ 2/9 12/13/2017  Decreased Interest 1  Down, Depressed, Hopeless 0  PHQ - 2 Score 1  Altered sleeping 3  Tired, decreased energy 0  Change in appetite 2  Feeling bad or failure about yourself  2  Trouble concentrating 0  Moving slowly or fidgety/restless 0  Suicidal thoughts 0  PHQ-9 Score 8  Difficult doing work/chores Not difficult at all     Prior to Admission medications   Medication Sig Start Date End Date Taking? Authorizing Provider  amLODipine (NORVASC) 10 MG tablet Take 10 mg daily by mouth.  08/08/17  Yes [provider]  buPROPion (WELLBUTRIN XL) 300 MG 24 hr tablet Take 300 mg daily by mouth.   Yes [provider]  lisinopril (PRINIVIL,ZESTRIL) 40 MG tablet Take 40 mg daily by mouth.  06/05/13  Yes [provider]  sertraline (ZOLOFT) 50 MG tablet Take 50 mg daily by mouth.  07/20/17  Yes [provider]  naproxen sodium (ALEVE) 220 MG tablet Take 660 mg daily by mouth. HIP PAIN.    [provider]     Allergies  Allergen Reactions  . Shellfish Allergy Anaphylaxis     Patient Active Problem List   Diagnosis Date Noted  . Unilateral primary osteoarthritis, right hip 10/07/2017  . Status post total replacement of right hip 10/07/2017  . HTN (hypertension) 06/12/2013  . Bilateral ovarian cysts 06/12/2013     Family History  Adopted: Yes     Social History   Socioeconomic History  . Marital status: Married    Spouse name: Kelsey Payne  . Number of children: 1  . Years of education: Not  on file  . Highest education level: Bachelor's degree (e.g., BA, AB, BS)  Social Needs  . Financial resource strain: Somewhat hard  . Food insecurity - worry: Never true  . Food insecurity - inability: Never true  . Transportation needs - medical: No  . Transportation needs - non-medical: No  Occupational History  . Occupation: Therapist, sports: [employer]     Comment: [employer]  Tobacco Use  . Smoking status: Former Smoker    Years: 36.00    Types: Cigarettes    Last attempt to quit: 05/29/2015    Years since quitting: 2.5  . Smokeless tobacco: Never Used  Substance and Sexual Activity  . Alcohol use: Yes    Alcohol/week: 0.6 oz    Types: 1 Glasses of wine per week    Comment: daily  . Drug use: Yes    Types: Marijuana    Comment: Occasional marijuana  . Sexual activity: Yes  Other Topics Concern  . Not on file  Social History Narrative   Lives with her husband.   They have 5 cats.   Their adult son may need to move in with them.         Objective:  Physical Exam  Constitutional: She is oriented to person, place, and time. She appears well-developed and well-nourished. She is active and cooperative. No distress.  BP 118/80 (BP Location: Right Arm, Patient Position: Sitting, Cuff Size: Normal)   Pulse 77   Temp 97.6 F (36.4 C) (Oral)   Resp 18   Ht 5\' 7"  (1.702 m)   Wt 222 lb (100.7 kg)   SpO2 95%   BMI 34.77 kg/m   HENT:  Head: Normocephalic and atraumatic.  Right Ear: Hearing normal.  Left Ear: Hearing normal.  Eyes: Conjunctivae are normal. No scleral icterus.  Neck: Normal range of motion. Neck supple. No thyromegaly present.  Cardiovascular: Normal rate, regular rhythm and normal heart sounds.  Pulses:      Radial pulses are 2+ on the right side, and 2+ on the left side.  R>L LE edema, pitting on the RIGHT.  Pulmonary/Chest: Effort normal and breath sounds normal.  Lymphadenopathy:       Head (right side): No tonsillar, no preauricular, no posterior auricular and no occipital adenopathy present.       Head (left side): No tonsillar, no preauricular, no posterior auricular and no occipital adenopathy present.    She has no cervical adenopathy.       Right: No supraclavicular adenopathy present.       Left: No supraclavicular adenopathy present.  Neurological: She is alert and oriented to  person, place, and time. No sensory deficit.  Skin: Skin is warm, dry and intact. No rash noted. No cyanosis or erythema. Nails show no clubbing.  Psychiatric: She has a normal mood and affect. Her speech is normal and behavior is normal.    Wt Readings from Last 3 Encounters:  12/13/17 222 lb (100.7 kg)  10/07/17 218 lb (98.9 kg)  09/28/17 218 lb (98.9 kg)       Assessment & Plan:   Problem List Items Addressed This Visit    HTN (hypertension) - Primary    Controlled. Continue amlodipine. Amlodipine may contribute to LE edema. Consider addition of or change to ACEI in the future.      Relevant Orders   Comprehensive metabolic panel   Bilateral ovarian cysts    Incidental finding on  evaluation of abdominal pain that turned out to be cholecystitis. Asymptomatic. No additional evaluation needed at this time.      BMI 34.0-34.9,adult    Continue healthier eating choices and increasing exercise.      Depression    Stable. Some life stressors presently, primarily financial. Monitor for worsening symptoms.       Other Visit Diagnoses    Screening for deficiency anemia       Relevant Orders   CBC with Differential/Platelet   Screening for diabetes mellitus       Relevant Orders   Comprehensive metabolic panel   Screening for hyperlipidemia       Relevant Orders   Lipid panel   Need for hepatitis C screening test       Relevant Orders   Hepatitis C antibody   Need for influenza vaccination       Screening for HIV (human immunodeficiency virus)       Relevant Orders   HIV antibody   Screening for thyroid disorder       Relevant Orders   TSH   Screening for blood or protein in urine       Relevant Orders   Urinalysis, dipstick only   Screening for cervical cancer       deferred. She will look for previous records. If unable to locate, will perform vaginal exam and determine if cervix remains. If not, will no longer need this.   Need for Tdap vaccination        declined.   Encounter for screening mammogram for breast cancer       Relevant Orders   MM DIGITAL SCREENING BILATERAL   Screening for colon cancer       Relevant Orders   Ambulatory referral to Gastroenterology       Return in about 6 months (around 06/12/2018) for re-evaluation of blood pressure.   Fara Chute, PA-C [facility]

## 2017-12-14 LAB — COMPREHENSIVE METABOLIC PANEL
A/G RATIO: 1.6 (ref 1.2–2.2)
ALBUMIN: 4.3 g/dL (ref 3.5–5.5)
ALT: 14 IU/L (ref 0–32)
AST: 15 IU/L (ref 0–40)
Alkaline Phosphatase: 89 IU/L (ref 39–117)
BUN / CREAT RATIO: 17 (ref 9–23)
BUN: 13 mg/dL (ref 6–24)
Bilirubin Total: 0.3 mg/dL (ref 0.0–1.2)
CALCIUM: 9.4 mg/dL (ref 8.7–10.2)
CO2: 24 mmol/L (ref 20–29)
CREATININE: 0.75 mg/dL (ref 0.57–1.00)
Chloride: 102 mmol/L (ref 96–106)
GFR, EST AFRICAN AMERICAN: 102 mL/min/{1.73_m2} (ref >59)
GFR, EST NON AFRICAN AMERICAN: 88 mL/min/{1.73_m2} (ref >59)
GLOBULIN, TOTAL: 2.7 g/dL (ref 1.5–4.5)
Glucose: 103 mg/dL — ABNORMAL HIGH (ref 65–99)
POTASSIUM: 4.4 mmol/L (ref 3.5–5.2)
SODIUM: 139 mmol/L (ref 134–144)
TOTAL PROTEIN: 7 g/dL (ref 6.0–8.5)

## 2017-12-14 LAB — LIPID PANEL
CHOL/HDL RATIO: 2.3 ratio (ref 0.0–4.4)
Cholesterol, Total: 198 mg/dL (ref 100–199)
HDL: 88 mg/dL (ref >39)
LDL CALC: 88 mg/dL (ref 0–99)
Triglycerides: 112 mg/dL (ref 0–149)
VLDL Cholesterol Cal: 22 mg/dL (ref 5–40)

## 2017-12-14 LAB — CBC WITH DIFFERENTIAL/PLATELET
BASOS: 0 %
Basophils Absolute: 0 10*3/uL (ref 0.0–0.2)
EOS (ABSOLUTE): 0.2 10*3/uL (ref 0.0–0.4)
EOS: 2 %
HEMATOCRIT: 42.9 % (ref 34.0–46.6)
HEMOGLOBIN: 14 g/dL (ref 11.1–15.9)
IMMATURE GRANULOCYTES: 0 %
Immature Grans (Abs): 0 10*3/uL (ref 0.0–0.1)
LYMPHS ABS: 2.8 10*3/uL (ref 0.7–3.1)
Lymphs: 28 %
MCH: 28.6 pg (ref 26.6–33.0)
MCHC: 32.6 g/dL (ref 31.5–35.7)
MCV: 88 fL (ref 79–97)
MONOCYTES: 6 %
MONOS ABS: 0.6 10*3/uL (ref 0.1–0.9)
NEUTROS PCT: 64 %
Neutrophils Absolute: 6.2 10*3/uL (ref 1.4–7.0)
Platelets: 281 10*3/uL (ref 150–379)
RBC: 4.89 x10E6/uL (ref 3.77–5.28)
RDW: 14.2 % (ref 12.3–15.4)
WBC: 9.7 10*3/uL (ref 3.4–10.8)

## 2017-12-14 LAB — URINALYSIS, DIPSTICK ONLY
Bilirubin, UA: NEGATIVE
GLUCOSE, UA: NEGATIVE
Ketones, UA: NEGATIVE
LEUKOCYTES UA: NEGATIVE
Nitrite, UA: NEGATIVE
Protein, UA: NEGATIVE
RBC, UA: NEGATIVE
Specific Gravity, UA: 1.026 (ref 1.005–1.030)
UUROB: 0.2 mg/dL (ref 0.2–1.0)
pH, UA: 6 (ref 5.0–7.5)

## 2017-12-14 LAB — TSH: TSH: 1.73 u[IU]/mL (ref 0.450–4.500)

## 2017-12-14 LAB — HIV ANTIBODY (ROUTINE TESTING W REFLEX): HIV SCREEN 4TH GENERATION: NONREACTIVE

## 2017-12-14 LAB — HEPATITIS C ANTIBODY

## 2018-01-12 ENCOUNTER — Encounter: Payer: Self-pay | Admitting: Physician Assistant

## 2018-01-25 ENCOUNTER — Ambulatory Visit
Admission: RE | Admit: 2018-01-25 | Discharge: 2018-01-25 | Disposition: A | Discharge status: Home / Self Care | Payer: BC Managed Care – PPO | Source: Ambulatory Visit | Attending: Physician Assistant | Admitting: Physician Assistant

## 2018-01-25 DIAGNOSIS — Z1231 Encounter for screening mammogram for malignant neoplasm of breast: Secondary | ICD-10-CM

## 2018-02-20 ENCOUNTER — Encounter: Payer: Self-pay | Admitting: Physician Assistant

## 2018-05-22 ENCOUNTER — Ambulatory Visit (INDEPENDENT_AMBULATORY_CARE_PROVIDER_SITE_OTHER): Payer: BC Managed Care – PPO | Admitting: Orthopaedic Surgery

## 2018-12-13 ENCOUNTER — Encounter: Payer: Self-pay | Admitting: Gastroenterology

## 2019-01-01 ENCOUNTER — Encounter: Payer: Self-pay | Admitting: Gastroenterology

## 2019-01-01 ENCOUNTER — Ambulatory Visit (AMBULATORY_SURGERY_CENTER): Payer: Self-pay

## 2019-01-01 VITALS — Ht 67.5 in | Wt 221.8 lb

## 2019-01-01 DIAGNOSIS — Z1211 Encounter for screening for malignant neoplasm of colon: Secondary | ICD-10-CM

## 2019-01-01 MED ORDER — NA SULFATE-K SULFATE-MG SULF 17.5-3.13-1.6 GM/177ML PO SOLN: 1.0000 | Freq: Once | ORAL | 0 refills | Status: AC

## 2019-01-01 NOTE — Progress Notes (Signed)
Per pt, no allergies to soy or egg products.Pt not taking any weight loss meds or using  O2 at home.  Pt refused emmi video. 

## 2019-01-09 ENCOUNTER — Encounter: Payer: Self-pay | Admitting: Gastroenterology

## 2019-01-09 ENCOUNTER — Ambulatory Visit (AMBULATORY_SURGERY_CENTER): Payer: BC Managed Care – PPO | Admitting: Gastroenterology

## 2019-01-09 VITALS — BP 118/69 | HR 67 | Temp 98.6°F | Resp 21 | Ht 67.5 in | Wt 221.0 lb

## 2019-01-09 DIAGNOSIS — D128 Benign neoplasm of rectum: Secondary | ICD-10-CM

## 2019-01-09 DIAGNOSIS — D123 Benign neoplasm of transverse colon: Secondary | ICD-10-CM | POA: Diagnosis not present

## 2019-01-09 DIAGNOSIS — D125 Benign neoplasm of sigmoid colon: Secondary | ICD-10-CM | POA: Diagnosis not present

## 2019-01-09 DIAGNOSIS — D120 Benign neoplasm of cecum: Secondary | ICD-10-CM

## 2019-01-09 DIAGNOSIS — D129 Benign neoplasm of anus and anal canal: Secondary | ICD-10-CM

## 2019-01-09 DIAGNOSIS — Z1211 Encounter for screening for malignant neoplasm of colon: Secondary | ICD-10-CM | POA: Diagnosis not present

## 2019-01-09 MED ORDER — SODIUM CHLORIDE 0.9 % IV SOLN: 500.0000 mL | Freq: Once | INTRAVENOUS | Status: DC

## 2019-01-09 NOTE — Op Note (Signed)
[facility] Patient Name: Kelsey Payne Procedure Date: 01/09/2019 7:32 AM MRN: [id number] Endoscopist: Thornton Park MD, MD Age: 58 Referring MD:  Date of Birth: 12-19-1960 Gender: Female Account #: [id number] Procedure:                Colonoscopy Indications:              Screening for colorectal malignant neoplasm, This                            is the patient's first colonoscopy. Adopted. Family                            history is unknown. Intermittent diarrhea following                            cholecystectomy. No other baseline GI symptoms. Medicines:                See the Anesthesia note for documentation of the                            administered medications Procedure:                Pre-Anesthesia Assessment:                           - Prior to the procedure, a History and Physical                            was performed, and patient medications and                            allergies were reviewed. The patient's tolerance of                            previous anesthesia was also reviewed. The risks                            and benefits of the procedure and the sedation                            options and risks were discussed with the patient.                            All questions were answered, and informed consent                            was obtained. Prior Anticoagulants: The patient has                            taken no previous anticoagulant or antiplatelet                            agents. ASA Grade Assessment: II - A patient with  mild systemic disease. After reviewing the risks                            and benefits, the patient was deemed in                            satisfactory condition to undergo the procedure.                           After obtaining informed consent, the colonoscope                            was passed under direct vision. Throughout the   procedure, the patient's blood pressure, pulse, and                            oxygen saturations were monitored continuously. The                            Colonoscope was introduced through the anus and                            advanced to the the terminal ileum, with                            identification of the appendiceal orifice and IC                            valve. The colonoscopy was performed without                            difficulty. The patient tolerated the procedure                            well. The quality of the bowel preparation was                            good. The terminal ileum, ileocecal valve,                            appendiceal orifice, and rectum were photographed. Scope In: 8:04:02 AM Scope Out: 8:20:15 AM Total Procedure Duration: 0 hours 16 minutes 13 seconds  Findings:                 The perianal and digital rectal examinations were                            normal.                           Multiple small and large-mouthed diverticula were                            found in the entire colon.  Six sessile polyps were found in the rectum,                            sigmoid colon, transverse colon, hepatic flexure                            and cecum. The polyps were 3 to 8 mm in size. These                            polyps were removed with a cold snare. Resection                            and retrieval were complete. Estimated blood loss                            was minimal.                           The exam was otherwise without abnormality on                            direct and retroflexion views. Complications:            No immediate complications. Estimated blood loss:                            Minimal. Estimated Blood Loss:     Estimated blood loss was minimal. Impression:               - Diverticulosis in the entire examined colon.                           - Six 3 to 8 mm polyps in the rectum, in  the                            sigmoid colon, in the transverse colon, at the                            hepatic flexure and in the cecum, removed with a                            cold snare. Resected and retrieved.                           - The examination was otherwise normal on direct                            and retroflexion views. Recommendation:           - Patient has a contact number available for                            emergencies. The signs and symptoms of potential  delayed complications were discussed with the                            patient. Return to normal activities tomorrow.                            Written discharge instructions were provided to the                            patient.                           - Resume previous diet today. High fiber diet                            recommended.                           - Continue present medications.                           - Await pathology results.                           - Repeat colonoscopy in 3 years for surveillance. Thornton Park MD, MD 01/09/2019 8:28:47 AM This report has been signed electronically.

## 2019-01-09 NOTE — Progress Notes (Signed)
Pt's states no medical or surgical changes since previsit or office visit. 

## 2019-01-09 NOTE — Patient Instructions (Signed)
Please read handouts provided. Continue present medications. Await pathology results.     YOU HAD AN ENDOSCOPIC PROCEDURE TODAY AT THE [facility]:   Refer to the procedure report that was given to you for any specific questions about what was found during the examination.  If the procedure report does not answer your questions, please call your gastroenterologist to clarify.  If you requested that your care partner not be given the details of your procedure findings, then the procedure report has been included in a sealed envelope for you to review at your convenience later.  YOU SHOULD EXPECT: Some feelings of bloating in the abdomen. Passage of more gas than usual.  Walking can help get rid of the air that was put into your GI tract during the procedure and reduce the bloating. If you had a lower endoscopy (such as a colonoscopy or flexible sigmoidoscopy) you may notice spotting of blood in your stool or on the toilet paper. If you underwent a bowel prep for your procedure, you may not have a normal bowel movement for a few days.  Please Note:  You might notice some irritation and congestion in your nose or some drainage.  This is from the oxygen used during your procedure.  There is no need for concern and it should clear up in a day or so.  SYMPTOMS TO REPORT IMMEDIATELY:   Following lower endoscopy (colonoscopy or flexible sigmoidoscopy):  Excessive amounts of blood in the stool  Significant tenderness or worsening of abdominal pains  Swelling of the abdomen that is new, acute  Fever of 100F or higher    For urgent or emergent issues, a gastroenterologist can be reached at any hour by calling [phone].   DIET:  We do recommend a small meal at first, but then you may proceed to your regular diet.  Drink plenty of fluids but you should avoid alcoholic beverages for 24 hours.  ACTIVITY:  You should plan to take it easy for the rest of today and you should NOT DRIVE  or use heavy machinery until tomorrow (because of the sedation medicines used during the test).    FOLLOW UP: Our staff will call the number listed on your records the next business day following your procedure to check on you and address any questions or concerns that you may have regarding the information given to you following your procedure. If we do not reach you, we will leave a message.  However, if you are feeling well and you are not experiencing any problems, there is no need to return our call.  We will assume that you have returned to your regular daily activities without incident.  If any biopsies were taken you will be contacted by phone or by letter within the next 1-3 weeks.  Please call us at [phone] if you have not heard about the biopsies in 3 weeks.    SIGNATURES/CONFIDENTIALITY: You and/or your care partner have signed paperwork which will be entered into your electronic medical record.  These signatures attest to the fact that that the information above on your After Visit Summary has been reviewed and is understood.  Full responsibility of the confidentiality of this discharge information lies with you and/or your care-partner. 

## 2019-01-09 NOTE — Progress Notes (Signed)
Called to room to assist during endoscopic procedure.  Patient ID and intended procedure confirmed with present staff. Received instructions for my participation in the procedure from the performing physician.  

## 2019-01-09 NOTE — Progress Notes (Signed)
Report given to PACU, vss 

## 2019-01-10 ENCOUNTER — Telehealth: Payer: Self-pay

## 2019-01-10 NOTE — Telephone Encounter (Signed)
  Follow up Call-  Call back number 01/09/2019  Post procedure Call Back phone  # [phone]  Permission to leave phone message Yes  Some recent data might be hidden     Patient questions:  Do you have a fever, pain , or abdominal swelling? No. Pain Score  0 *  Have you tolerated food without any problems? Yes.    Have you been able to return to your normal activities? Yes.    Do you have any questions about your discharge instructions: Diet   No. Medications  No. Follow up visit  No.  Do you have questions or concerns about your Care? No.  Actions: * If pain score is 4 or above: No action needed, pain <4.

## 2019-01-11 ENCOUNTER — Encounter: Payer: Self-pay | Admitting: Gastroenterology

## 2020-10-31 ENCOUNTER — Telehealth: Payer: Self-pay

## 2020-10-31 NOTE — Telephone Encounter (Signed)
Error

## 2021-07-30 ENCOUNTER — Other Ambulatory Visit: Payer: Self-pay | Admitting: Family Medicine

## 2021-07-30 DIAGNOSIS — Z1231 Encounter for screening mammogram for malignant neoplasm of breast: Secondary | ICD-10-CM

## 2021-09-02 ENCOUNTER — Ambulatory Visit: Payer: Self-pay

## 2021-10-02 ENCOUNTER — Other Ambulatory Visit: Payer: Self-pay

## 2021-10-02 ENCOUNTER — Ambulatory Visit
Admission: RE | Admit: 2021-10-02 | Discharge: 2021-10-02 | Disposition: A | Discharge status: Home / Self Care | Payer: BC Managed Care – PPO | Source: Ambulatory Visit | Attending: Family Medicine | Admitting: Family Medicine

## 2021-10-02 DIAGNOSIS — Z1231 Encounter for screening mammogram for malignant neoplasm of breast: Secondary | ICD-10-CM

## 2022-01-20 ENCOUNTER — Encounter: Payer: Self-pay | Admitting: Gastroenterology

## 2022-08-27 ENCOUNTER — Encounter: Payer: Self-pay | Admitting: Gastroenterology

## 2022-09-23 ENCOUNTER — Ambulatory Visit (AMBULATORY_SURGERY_CENTER): Payer: Self-pay

## 2022-09-23 VITALS — Ht 67.5 in | Wt 212.0 lb

## 2022-09-23 DIAGNOSIS — Z86010 Personal history of colonic polyps: Secondary | ICD-10-CM

## 2022-09-23 MED ORDER — NA SULFATE-K SULFATE-MG SULF 17.5-3.13-1.6 GM/177ML PO SOLN: 1.0000 | Freq: Once | ORAL | 0 refills | Status: AC

## 2022-09-23 NOTE — Progress Notes (Signed)
No egg or soy allergy known to patient  No issues known to pt with past sedation with any surgeries or procedures Patient denies ever being told they had issues or difficulty with intubation  No FH of Malignant Hyperthermia Pt is not on diet pills Pt is not on  home 02  Pt is not on blood thinners  Pt denies issues with constipation  No A fib or A flutter Have any cardiac testing pending--no Pt instructed to use Singlecare.com or GoodRx for a price reduction on prep  Patient's chart reviewed by Kelsey Payne CNRA prior to previsit and patient appropriate for the [facility].  Previsit completed and red dot placed by patient's name on their procedure day (on provider's schedule).    

## 2022-09-28 ENCOUNTER — Encounter: Payer: BC Managed Care – PPO | Admitting: Gastroenterology

## 2022-10-18 ENCOUNTER — Encounter: Payer: Self-pay | Admitting: Gastroenterology

## 2022-10-19 ENCOUNTER — Telehealth: Payer: Self-pay | Admitting: Gastroenterology

## 2022-10-19 NOTE — Telephone Encounter (Signed)
Good Morning Dr. Tarri Glenn,   Patient called stating that she needed to cancel her procedure with you on 12/7 at 8:00 due to getting the pre cert letter in the mail and calling her insurance to discuss with them how it was going to be considered.

## 2022-10-21 ENCOUNTER — Encounter: Payer: BC Managed Care – PPO | Admitting: Gastroenterology

## 2023-07-24 IMAGING — MG MM DIGITAL SCREENING BILAT W/ TOMO AND CAD
8 series · 8 of 24 positions shown · non-contrast
Comparison: Previous exam(s).

CLINICAL DATA: Screening.

EXAM:
DIGITAL SCREENING BILATERAL MAMMOGRAM WITH TOMOSYNTHESIS AND CAD
TECHNIQUE: Bilateral screening digital craniocaudal and mediolateral oblique
mammograms were obtained. Bilateral screening digital breast
tomosynthesis was performed. The images were evaluated with
computer-aided detection.

[R CC synth-2D]
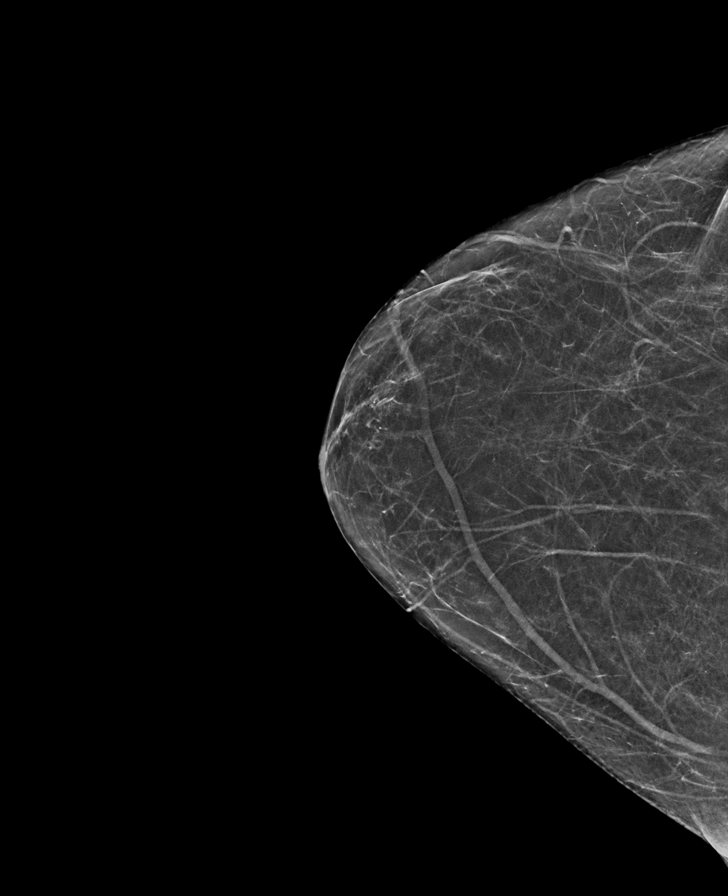

[R MLO synth-2D]
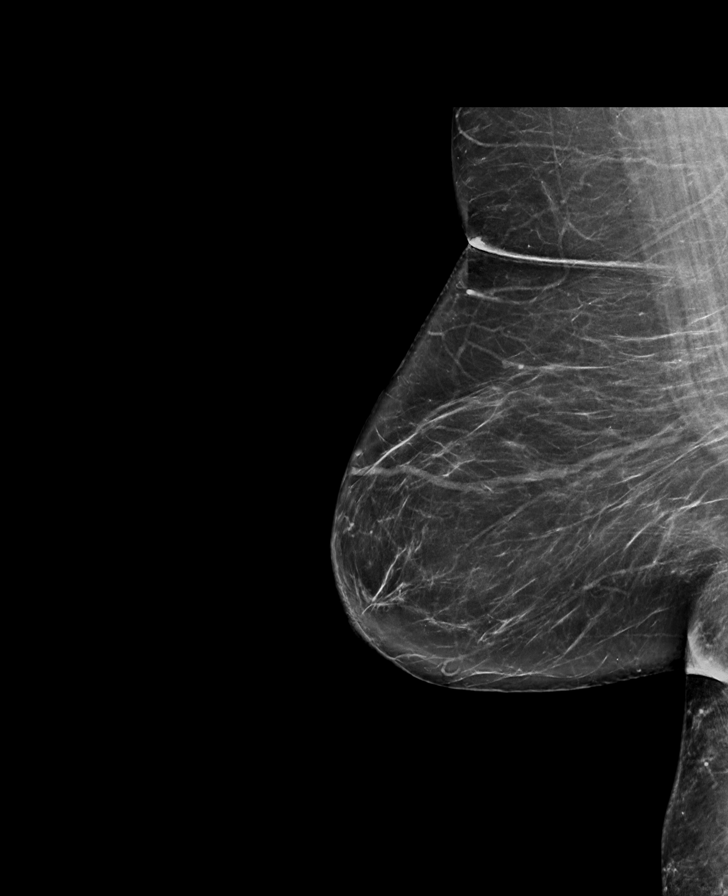

[L CC synth-2D]
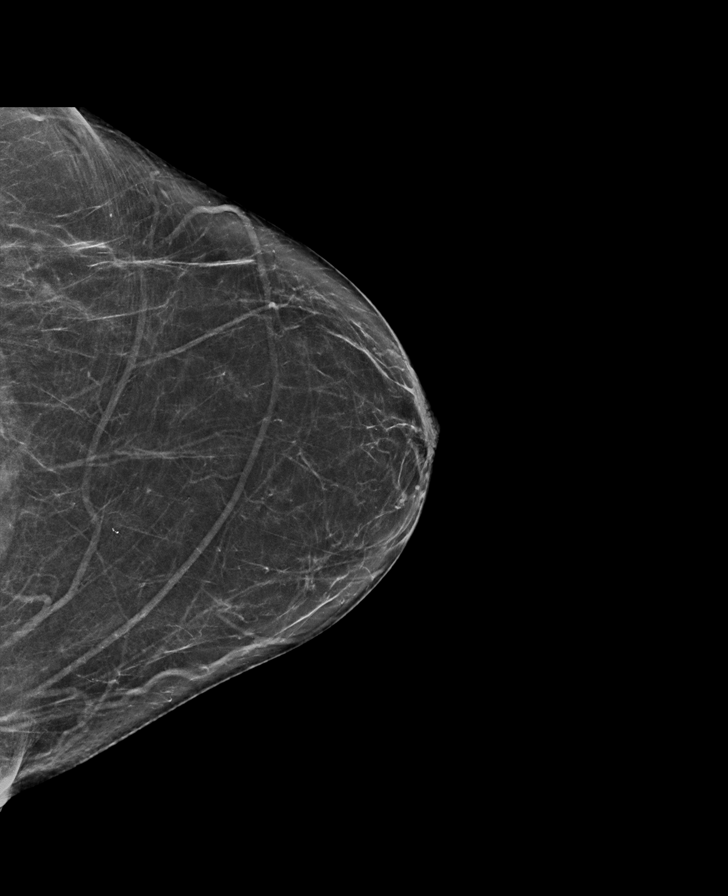

[L MLO synth-2D]
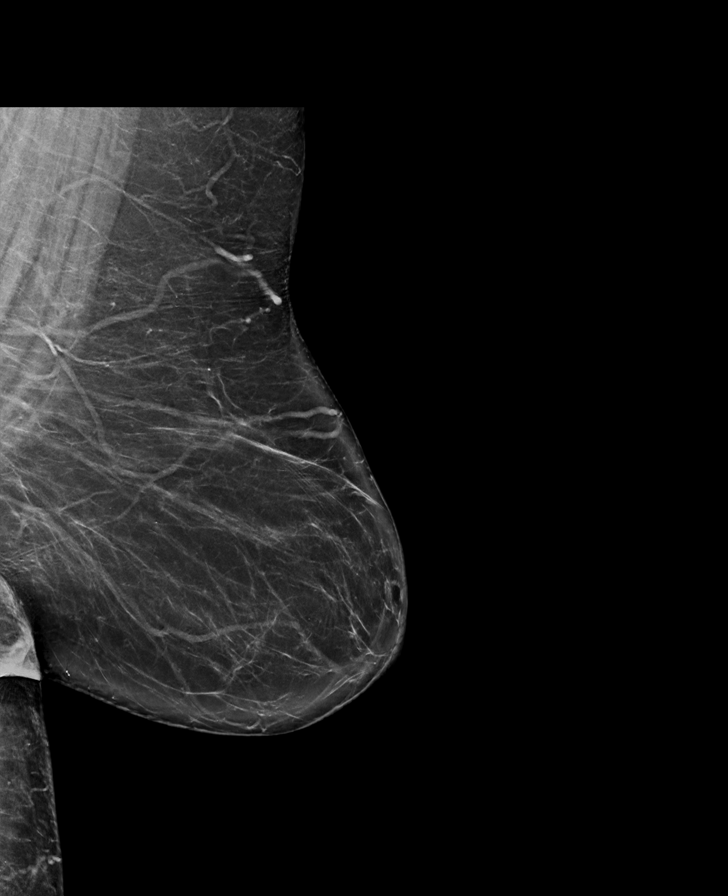

[R CC tomo · tomo slice 29/58.0]
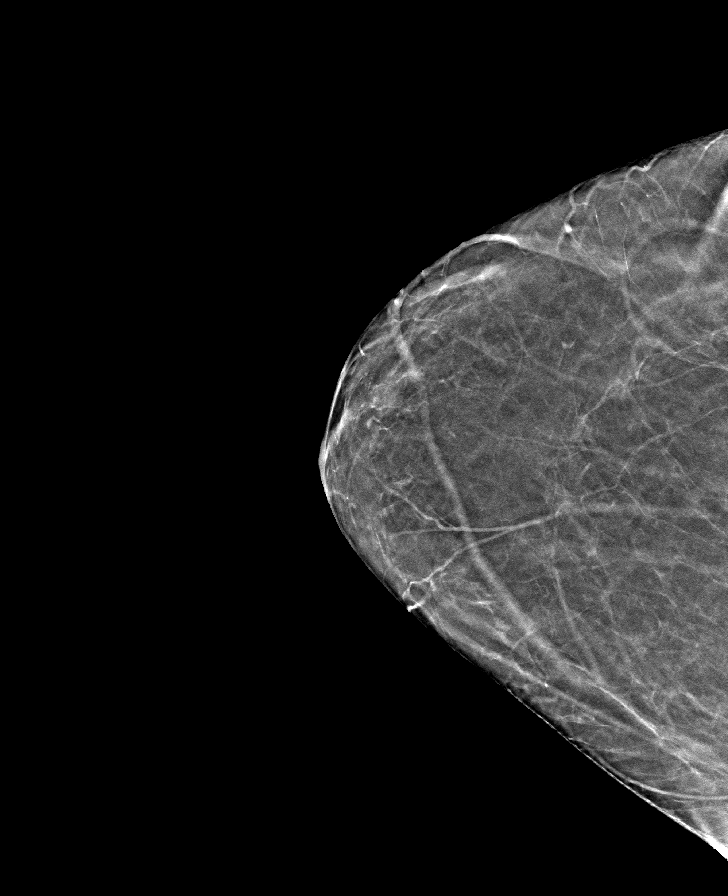

[R MLO tomo · tomo slice 41/80.0]
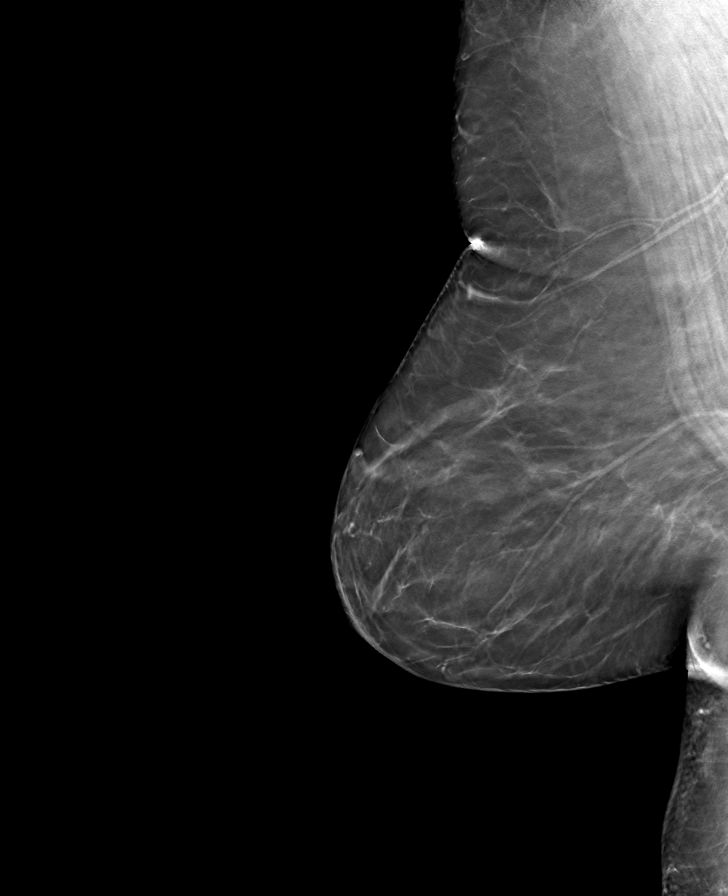

[L MLO tomo · tomo slice 41/82.0]
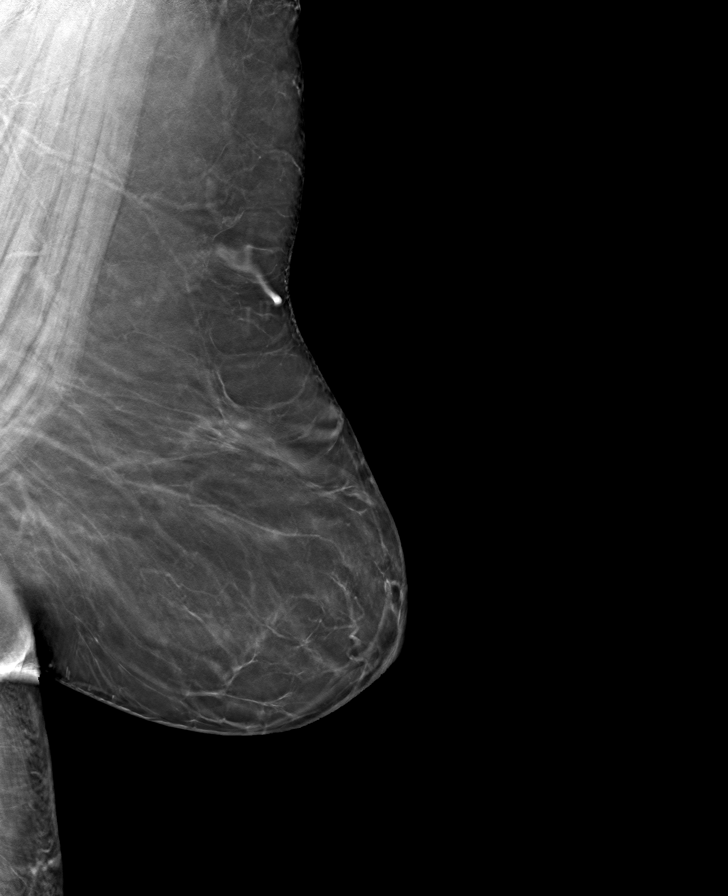

[L CC tomo · tomo slice 34/67.0]
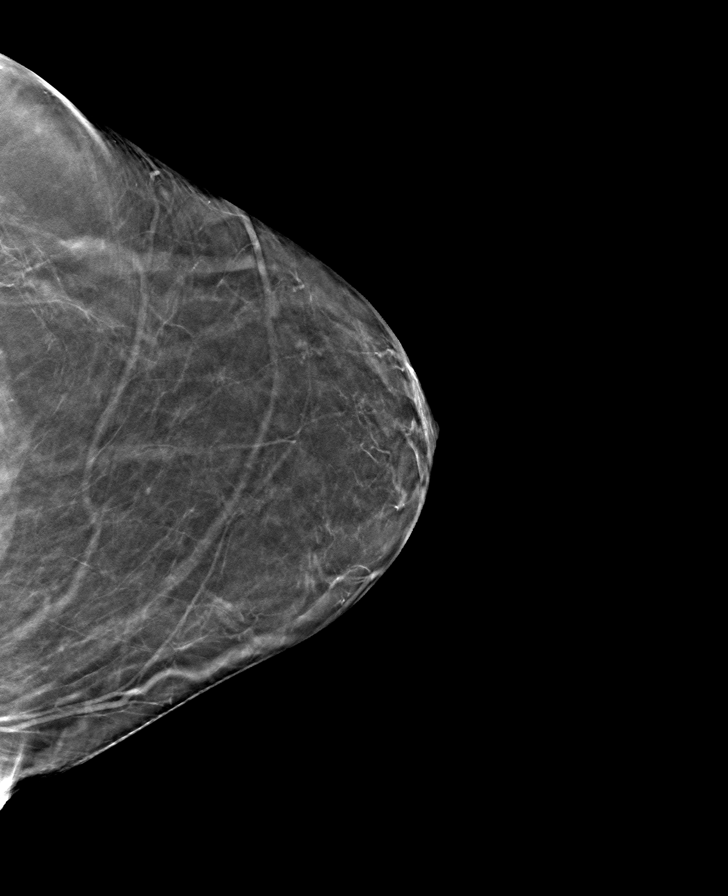

[8 of 24 positions shown; findings below may reference images not displayed]

ACR Breast Density Category b: There are scattered areas of
fibroglandular density.
FINDINGS: There are no findings suspicious for malignancy.
IMPRESSION: No mammographic evidence of malignancy. A result letter of this
screening mammogram will be mailed directly to the patient.

RECOMMENDATION:
Screening mammogram in one year. (Code:51-O-LD2)

BI-RADS CATEGORY  1: Negative.

## 2023-08-30 ENCOUNTER — Other Ambulatory Visit: Payer: Self-pay | Admitting: Family Medicine

## 2023-08-30 DIAGNOSIS — Z1231 Encounter for screening mammogram for malignant neoplasm of breast: Secondary | ICD-10-CM

## 2023-09-26 ENCOUNTER — Ambulatory Visit: Payer: BC Managed Care – PPO

## 2023-10-19 ENCOUNTER — Ambulatory Visit
Admission: RE | Admit: 2023-10-19 | Discharge: 2023-10-19 | Disposition: A | Discharge status: Home / Self Care | Payer: BC Managed Care – PPO | Source: Ambulatory Visit | Attending: Family Medicine | Admitting: Family Medicine

## 2023-10-19 DIAGNOSIS — Z1231 Encounter for screening mammogram for malignant neoplasm of breast: Secondary | ICD-10-CM

## 2024-07-20 ENCOUNTER — Other Ambulatory Visit: Payer: Self-pay | Admitting: Medical Genetics

## 2024-09-26 ENCOUNTER — Encounter: Payer: Self-pay | Admitting: Internal Medicine

## 2024-10-29 ENCOUNTER — Other Ambulatory Visit: Payer: Self-pay | Admitting: Family Medicine

## 2024-10-29 DIAGNOSIS — Z1231 Encounter for screening mammogram for malignant neoplasm of breast: Secondary | ICD-10-CM

## 2024-11-12 ENCOUNTER — Ambulatory Visit: Payer: Self-pay

## 2024-11-12 VITALS — Ht 67.5 in | Wt 200.0 lb

## 2024-11-12 DIAGNOSIS — Z860100 Personal history of colon polyps, unspecified: Secondary | ICD-10-CM

## 2024-11-12 MED ORDER — NA SULFATE-K SULFATE-MG SULF 17.5-3.13-1.6 GM/177ML PO SOLN: 1.0000 | Freq: Once | ORAL | 0 refills | Status: AC

## 2024-11-12 NOTE — Progress Notes (Signed)

## 2024-11-20 ENCOUNTER — Encounter: Payer: Self-pay | Admitting: Internal Medicine

## 2024-11-25 NOTE — Progress Notes (Unsigned)
 [facility] History and Physical   Primary Care Physician:  Burney Darice CROME, MD   Reason for Procedure:    Encounter Diagnosis  Name Primary?   Hx of colonic polyps Yes     Plan:    Colonoscopy   The patient was provided an opportunity to ask questions and all were answered. The patient agreed with the plan.   HPI: Kelsey Payne is a 64 y.o. female status post removal of 6 adenomas maximum 8 mm in 2020 at colonoscopy by Dr. Eda.  Also had diverticulosis.   Past Medical History:  Diagnosis Date   Acute cholecystitis 06/12/2013   Anxiety    Arthritis    Cancer (HCC)    basal cell nose   Depression    Diarrhea    per pt, has frequent stools past gallbladder surgery in 2014   Hypertension     Past Surgical History:  Procedure Laterality Date   ABDOMINAL HYSTERECTOMY     CESAREAN SECTION     CHOLECYSTECTOMY  06/12/2013   Procedure: LAPAROSCOPIC CHOLECYSTECTOMY;  Surgeon: Dann FORBES Hummer, MD;  Location: [facility] OR;  Service: General;;   COLONOSCOPY     TOOTH EXTRACTION     TOTAL HIP ARTHROPLASTY Right 10/07/2017   Procedure: RIGHT TOTAL HIP ARTHROPLASTY ANTERIOR APPROACH;  Surgeon: Vernetta Lonni GRADE, MD;  Location: [facility];  Service: Orthopedics;  Laterality: Right;   TUBAL LIGATION       Current Outpatient Medications  Medication Sig Dispense Refill   amLODipine  (NORVASC ) 10 MG tablet Take 10 mg daily by mouth.      buPROPion  (WELLBUTRIN  XL) 300 MG 24 hr tablet Take 300 mg daily by mouth.     hydrochlorothiazide  (HYDRODIURIL ) 25 MG tablet Take 25 mg by mouth daily.     lamoTRIgine (LAMICTAL) 25 MG tablet Take 25 mg by mouth daily.     lisinopril  (PRINIVIL ,ZESTRIL ) 40 MG tablet Take 40 mg daily by mouth.      Multiple Vitamin (MULTIVITAMIN) tablet Take 1 tablet by mouth daily. Women multivitamin-Take one daily     rosuvastatin (CRESTOR) 10 MG tablet Take 10 mg by mouth at bedtime.     sertraline  (ZOLOFT ) 50 MG tablet Take 50 mg daily by mouth.       spironolactone (ALDACTONE) 25 MG tablet Take 25 mg by mouth daily.     VITAMIN D PO Take 50,000 Units by mouth once a week.     Vitamin D, Ergocalciferol, (DRISDOL) 1.25 MG (50000 UNIT) CAPS capsule Take 50,000 Units by mouth once a week.     ALPRAZolam (XANAX) 0.25 MG tablet Take 0.25 mg by mouth as needed for anxiety. (Patient not taking: Reported on 11/26/2024)     Current Facility-Administered Medications  Medication Dose Route Frequency Provider Last Rate Last Admin   0.9 %  sodium chloride  infusion  500 mL Intravenous Continuous Avram Lupita FORBES, MD        Allergies as of 11/26/2024 - Review Complete 11/26/2024  Allergen Reaction Noted   Shellfish allergy Anaphylaxis 06/11/2013   Tape Dermatitis 01/09/2019    Family History  Adopted: Yes  Problem Relation Age of Onset   Leukemia Mother     Social History   Socioeconomic History   Marital status: Married    Spouse name: Sacoya Mcgourty   Number of children: 1   Years of education: Not on file   Highest education level: Bachelor's degree (e.g., BA, AB, BS)  Occupational History   Occupation: Print Production Planner  Employer: [employer]     Comment: [employer]  Tobacco Use   Smoking status: Every Day    Current packs/day: 0.00    Types: Cigarettes    Start date: 52    Last attempt to quit: 2021    Years since quitting: 5.0   Smokeless tobacco: Never   Tobacco comments:    smokes 5-6 cigarettes a day no longer as of 2021      Vaping Use   Vaping status: Former   Substances: Nicotine, Nicotine-salt   Devices: uses about 10 puffs per day  Substance and Sexual Activity   Alcohol use: Yes    Alcohol/week: 14.0 standard drinks of alcohol    Types: 14 Glasses of wine per week    Comment: daily   Drug use: Yes    Types: Marijuana    Comment: Occasional marijuana, last use 09/13/22   Sexual activity: Yes    Birth control/protection: Post-menopausal  Other Topics Concern   Not on file   Social History Narrative   Lives with her husband.   They have 5 cats.   Their adult son may need to move in with them.   Social Drivers of Health   Tobacco Use: High Risk (11/26/2024)   Patient History    Smoking Tobacco Use: Every Day    Smokeless Tobacco Use: Never    Passive Exposure: Not on file  Financial Resource Strain: Not on file  Food Insecurity: Not on file  Transportation Needs: Not on file  Physical Activity: Not on file  Stress: Not on file  Social Connections: Not on file  Intimate Partner Violence: Not on file  Depression (EYV7-0): Not on file  Alcohol Screen: Not on file  Housing: Not on file  Utilities: Not on file  Health Literacy: Not on file    Review of Systems:  All other review of systems negative except as mentioned in the HPI.  Physical Exam: Vital signs BP (!) 115/59   Pulse (!) 58   Temp (!) 97.3 F (36.3 C)   Resp 16   Ht 5' 7.5 (1.715 m)   Wt 200 lb (90.7 kg)   SpO2 94%   BMI 30.86 kg/m   General:   Alert,  Well-developed, well-nourished, pleasant and cooperative in NAD Lungs:  Clear throughout to auscultation.   Heart:  Regular rate and rhythm; no murmurs, clicks, rubs,  or gallops. Abdomen:  Soft, nontender and nondistended. Normal bowel sounds.   Neuro/Psych:  Alert and cooperative. Normal mood and affect. A and O x 3   @Randee Upchurch  CHARLENA Commander, MD, [facility] [phone] (pager) 11/26/2024 8:38 AM@

## 2024-11-26 ENCOUNTER — Encounter: Payer: Self-pay | Admitting: Internal Medicine

## 2024-11-26 ENCOUNTER — Ambulatory Visit: Payer: Self-pay | Admitting: Internal Medicine

## 2024-11-26 ENCOUNTER — Ambulatory Visit: Payer: Self-pay

## 2024-11-26 VITALS — BP 103/54 | HR 62 | Temp 97.3°F | Resp 18 | Ht 67.5 in | Wt 200.0 lb

## 2024-11-26 DIAGNOSIS — Z860100 Personal history of colon polyps, unspecified: Secondary | ICD-10-CM

## 2024-11-26 DIAGNOSIS — Z1211 Encounter for screening for malignant neoplasm of colon: Secondary | ICD-10-CM | POA: Diagnosis present

## 2024-11-26 DIAGNOSIS — K5730 Diverticulosis of large intestine without perforation or abscess without bleeding: Secondary | ICD-10-CM | POA: Diagnosis not present

## 2024-11-26 DIAGNOSIS — Z860101 Personal history of adenomatous and serrated colon polyps: Secondary | ICD-10-CM | POA: Diagnosis not present

## 2024-11-26 DIAGNOSIS — D123 Benign neoplasm of transverse colon: Secondary | ICD-10-CM | POA: Diagnosis not present

## 2024-11-26 MED ORDER — SODIUM CHLORIDE 0.9 % IV SOLN: 500.0000 mL | INTRAVENOUS | Status: AC

## 2024-11-26 NOTE — Patient Instructions (Addendum)
 I found and removed 2 tiny polyps that look benign. You still have diverticulosis, the pouches and pockets in the colon.  I will have the polyps analyzed and make a recommendation about when to repeat a colonoscopy, should be in about 5 years.  I hope your husband does well with his treatment.  I appreciate the opportunity to care for you. Lupita CHARLENA Commander, MD, FACG  YOU HAD AN ENDOSCOPIC PROCEDURE TODAY AT THE [facility]:   Refer to the procedure report that was given to you for any specific questions about what was found during the examination.  If the procedure report does not answer your questions, please call your gastroenterologist to clarify.  If you requested that your care partner not be given the details of your procedure findings, then the procedure report has been included in a sealed envelope for you to review at your convenience later.  YOU SHOULD EXPECT: Some feelings of bloating in the abdomen. Passage of more gas than usual.  Walking can help get rid of the air that was put into your GI tract during the procedure and reduce the bloating. If you had a lower endoscopy (such as a colonoscopy or flexible sigmoidoscopy) you may notice spotting of blood in your stool or on the toilet paper. If you underwent a bowel prep for your procedure, you may not have a normal bowel movement for a few days.  Please Note:  You might notice some irritation and congestion in your nose or some drainage.  This is from the oxygen used during your procedure.  There is no need for concern and it should clear up in a day or so.  SYMPTOMS TO REPORT IMMEDIATELY:  Following lower endoscopy (colonoscopy or flexible sigmoidoscopy):  Excessive amounts of blood in the stool  Significant tenderness or worsening of abdominal pains  Swelling of the abdomen that is new, acute  Fever of 100F or higher  For urgent or emergent issues, a gastroenterologist can be reached at any hour by calling [phone]  [phone]. Do not use MyChart messaging for urgent concerns.    DIET:  We do recommend a small meal at first, but then you may proceed to your regular diet.  Drink plenty of fluids but you should avoid alcoholic beverages for 24 hours.  ACTIVITY:  You should plan to take it easy for the rest of today and you should NOT DRIVE or use heavy machinery until tomorrow (because of the sedation medicines used during the test).    FOLLOW UP: Our staff will call the number listed on your records the next business day following your procedure.  We will call around 7:15- 8:00 am to check on you and address any questions or concerns that you may have regarding the information given to you following your procedure. If we do not reach you, we will leave a message.     If any biopsies were taken you will be contacted by phone or by letter within the next 1-3 weeks.  Please call us  at [phone] if you have not heard about the biopsies in 3 weeks.    SIGNATURES/CONFIDENTIALITY: You and/or your care partner have signed paperwork which will be entered into your electronic medical record.  These signatures attest to the fact that that the information above on your After Visit Summary has been reviewed and is understood.  Full responsibility of the confidentiality of this discharge information lies with you and/or your care-partner.

## 2024-11-26 NOTE — Op Note (Signed)
 [facility] Patient Name: Kelsey Payne Procedure Date: 11/26/2024 8:24 AM MRN: [id number] Endoscopist: Lupita FORBES Commander , MD, [id number] Age: 64 Referring MD:  Date of Birth: 10-Jun-1961 Gender: Female Account #: [id number] Procedure:                Colonoscopy Indications:              Surveillance: Personal history of adenomatous                            polyps on last colonoscopy > 5 years ago, Last                            colonoscopy: February 2020 Medicines:                Monitored Anesthesia Care Procedure:                Pre-Anesthesia Assessment:                           - Prior to the procedure, a History and Physical                            was performed, and patient medications and                            allergies were reviewed. The patient's tolerance of                            previous anesthesia was also reviewed. The risks                            and benefits of the procedure and the sedation                            options and risks were discussed with the patient.                            All questions were answered, and informed consent                            was obtained. Prior Anticoagulants: The patient has                            taken no anticoagulant or antiplatelet agents. ASA                            Grade Assessment: III - A patient with severe                            systemic disease. After reviewing the risks and                            benefits, the patient was deemed in satisfactory  condition to undergo the procedure.                           After obtaining informed consent, the colonoscope                            was passed under direct vision. Throughout the                            procedure, the patient's blood pressure, pulse, and                            oxygen saturations were monitored continuously. The                            [id number]-HQ190L Colonoscope [id number]  was introduced                            through the anus and advanced to the the cecum,                            identified by appendiceal orifice and ileocecal                            valve. The colonoscopy was performed without                            difficulty. The patient tolerated the procedure                            well. The quality of the bowel preparation was                            adequate. The ileocecal valve, appendiceal orifice,                            and rectum were photographed. The bowel preparation                            used was SUPREP via split dose instruction. Scope In: 8:45:32 AM Scope Out: 9:06:27 AM Scope Withdrawal Time: 0 hours 17 minutes 43 seconds  Total Procedure Duration: 0 hours 20 minutes 55 seconds  Findings:                 The perianal and digital rectal examinations were                            normal.                           Two sessile polyps were found in the transverse                            colon. The polyps were diminutive in size. These  polyps were removed with a cold snare. Resection                            and retrieval were complete. Verification of                            patient identification for the specimen was done.                            Estimated blood loss was minimal.                           Multiple diverticula were found in the sigmoid                            colon, descending colon and transverse colon.                           The exam was otherwise without abnormality on                            direct and retroflexion views. Complications:            No immediate complications. Estimated Blood Loss:     Estimated blood loss was minimal. Impression:               - Two diminutive polyps in the transverse colon,                            removed with a cold snare. Resected and retrieved.                           - Diverticulosis in the sigmoid  colon, in the                            descending colon and in the transverse colon.                           - The examination was otherwise normal on direct                            and retroflexion views.                           - Personal history of colonic polyps. 6 adenomas                            12/2018, max 8 mm Recommendation:           - Patient has a contact number available for                            emergencies. The signs and symptoms of potential  delayed complications were discussed with the                            patient. Return to normal activities tomorrow.                            Written discharge instructions were provided to the                            patient.                           - Resume previous diet.                           - Continue present medications.                           - Repeat colonoscopy is recommended for                            surveillance. The colonoscopy date will be                            determined after pathology results from today's                            exam become available for review. Lupita FORBES Commander, MD 11/26/2024 9:14:09 AM This report has been signed electronically.

## 2024-11-26 NOTE — Progress Notes (Signed)
 Called to room to assist during endoscopic procedure.  Patient ID and intended procedure confirmed with present staff. Received instructions for my participation in the procedure from the performing physician.

## 2024-11-26 NOTE — Progress Notes (Signed)
 Pt's states no medical or surgical changes since previsit or office visit.

## 2024-11-26 NOTE — Progress Notes (Signed)
 Report to PACU, RN, vss, BBS= Clear.

## 2024-11-27 ENCOUNTER — Telehealth: Payer: Self-pay

## 2024-11-27 NOTE — Telephone Encounter (Signed)
" °  Follow up Call-     11/26/2024    7:36 AM  Call back number  Post procedure Call Back phone  # [phone]  Permission to leave phone message Yes     Patient questions:  Do you have a fever, pain , or abdominal swelling? No. Pain Score  0 *  Have you tolerated food without any problems? Yes.    Have you been able to return to your normal activities? Yes.    Do you have any questions about your discharge instructions: Diet   No. Medications  No. Follow up visit  No.  Do you have questions or concerns about your Care? No.  Actions: * If pain score is 4 or above: No action needed, pain <4.   "

## 2024-11-29 LAB — SURGICAL PATHOLOGY

## 2024-12-04 ENCOUNTER — Ambulatory Visit: Payer: Self-pay | Admitting: Internal Medicine

## 2024-12-07 ENCOUNTER — Ambulatory Visit: Payer: Self-pay

## 2024-12-18 ENCOUNTER — Ambulatory Visit: Payer: Self-pay

## 2024-12-21 ENCOUNTER — Ambulatory Visit: Payer: Self-pay
